# Patient Record
Sex: Female | Born: 1995 | Race: Black or African American | Hispanic: No | Marital: Single | Smoking: Never smoker
Health system: Southern US, Community
[De-identification: ages and names within clinical notes are randomized; demographics above are authoritative.]

## PROBLEM LIST (undated history)

## (undated) DIAGNOSIS — R569 Unspecified convulsions: Secondary | ICD-10-CM

## (undated) DIAGNOSIS — I2699 Other pulmonary embolism without acute cor pulmonale: Secondary | ICD-10-CM

## (undated) DIAGNOSIS — F419 Anxiety disorder, unspecified: Secondary | ICD-10-CM

## (undated) DIAGNOSIS — F329 Major depressive disorder, single episode, unspecified: Secondary | ICD-10-CM

## (undated) DIAGNOSIS — J189 Pneumonia, unspecified organism: Secondary | ICD-10-CM

## (undated) DIAGNOSIS — F32A Depression, unspecified: Secondary | ICD-10-CM

## (undated) HISTORY — DX: Depression, unspecified: F32.A

## (undated) HISTORY — PX: NO PAST SURGERIES: SHX2092

## (undated) HISTORY — DX: Major depressive disorder, single episode, unspecified: F32.9

---
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
## 2014-12-23 ENCOUNTER — Emergency Department (HOSPITAL_COMMUNITY)
Admission: EM | Admit: 2014-12-23 | Discharge: 2014-12-23 | Disposition: A | Discharge status: Home / Self Care | Payer: Medicaid Other | Attending: Emergency Medicine | Admitting: Emergency Medicine

## 2014-12-23 ENCOUNTER — Encounter (HOSPITAL_COMMUNITY): Payer: Self-pay | Admitting: Physical Medicine and Rehabilitation

## 2014-12-23 DIAGNOSIS — I159 Secondary hypertension, unspecified: Secondary | ICD-10-CM | POA: Insufficient documentation

## 2014-12-23 DIAGNOSIS — B085 Enteroviral vesicular pharyngitis: Secondary | ICD-10-CM | POA: Insufficient documentation

## 2014-12-23 DIAGNOSIS — Z880 Allergy status to penicillin: Secondary | ICD-10-CM | POA: Insufficient documentation

## 2014-12-23 MED ORDER — SUCRALFATE 1 GM/10ML PO SUSP: 0.5000 g | Freq: Three times a day (TID) | ORAL | Status: DC

## 2014-12-23 MED ORDER — SUCRALFATE 1 GM/10ML PO SUSP
0.5000 g | Freq: Once | ORAL | Status: AC
  Administered 2014-12-23: 0.5 g via ORAL
  Filled 2014-12-23 (×2): qty 10

## 2014-12-23 NOTE — ED Provider Notes (Signed)
CSN: [id number]     Arrival date & time 12/23/14  1009 History  This chart was scribed for non-physician practitioner Francee PiccoloJennifer Apple Dearmas, PA-C working with Tilden FossaElizabeth Rees, MD by Lyndel SafeKaitlyn Shelton, ED Scribe. This patient was seen in room TR06C/TR06C and the patient's care was started at 10:27 AM.   Chief Complaint  Patient presents with  . Mouth Lesions   Patient is a 19 y.o. female presenting with mouth sores. The history is provided by the patient. No language interpreter was used.  Mouth Lesions Onset quality:  Sudden Severity:  Moderate Duration:  1 day Progression:  Unchanged Chronicity:  New Relieved by:  None tried Worsened by:  Nothing tried Ineffective treatments:  None tried Associated symptoms: no congestion, no fever and no rhinorrhea    HPI Comments: Makayla Livingston is a 19 y.o. female who presents to the Emergency Department complaining of constant, moderate mouth pain with associated lesions onset 1 day ago. She has not taken an alleviating factors pta. She denies experiencing similar symptoms in the past. Pt also denies spots on hands or feet, contacts with children, fevers, rhinorrhea, cough, or congestion.   History reviewed. No pertinent past medical history. History reviewed. No pertinent past surgical history. No family history on file. History  Substance Use Topics  . Smoking status: Never Smoker   . Smokeless tobacco: Not on file  . Alcohol Use: No   OB History    No data available     Review of Systems  Constitutional: Negative for fever.  HENT: Positive for mouth sores. Negative for congestion and rhinorrhea.   Respiratory: Negative for cough.   All other systems reviewed and are negative.   Allergies  Penicillins  Home Medications   Prior to Admission medications   Medication Sig Start Date End Date Taking? Authorizing Provider  Chlorpheniramine Maleate (ALLERGY PO) Take 1 tablet by mouth daily as needed (seasonal allergies).    Historical  Provider, MD  ibuprofen (ADVIL,MOTRIN) 600 MG tablet Take 1 tablet (600 mg total) by mouth every 6 (six) hours as needed for pain. 01/12/13   Marcellina Millinimothy Galey, MD  sucralfate (CARAFATE) 1 GM/10ML suspension Take 5 mLs (0.5 g total) by mouth 4 (four) times daily -  with meals and at bedtime. 12/23/14   Tarissa Kerin, PA-C   BP 143/101 mmHg  Pulse 110  Temp(Src) 98.3 F (36.8 C) (Oral)  Resp 18  SpO2 99% Physical Exam  Constitutional: She is oriented to person, place, and time. She appears well-developed and well-nourished. No distress.  HENT:  Head: Normocephalic and atraumatic.  Right Ear: External ear normal.  Left Ear: External ear normal.  Nose: Nose normal.  Mouth/Throat: Uvula is midline and oropharynx is clear and moist. Oral lesions (buccal ulcerations, tongue ulcerations) present.  Posterior ulcerations. No trismus. No tonsillar swelling or abscess  Eyes: Conjunctivae are normal.  Neck: Normal range of motion. Neck supple.  No nuchal rigidity.   Cardiovascular: Normal rate, regular rhythm and normal heart sounds.   Pulmonary/Chest: Effort normal and breath sounds normal. No respiratory distress.  Abdominal: Soft. There is no tenderness.  Musculoskeletal: Normal range of motion.  Neurological: She is alert and oriented to person, place, and time.  Skin: Skin is warm and dry. She is not diaphoretic.  Psychiatric: She has a normal mood and affect.  Nursing note and vitals reviewed.   ED Course  Procedures  Medications  sucralfate (CARAFATE) 1 GM/10ML suspension 0.5 g (0.5 g Oral Given 12/23/14 1212)  DIAGNOSTIC STUDIES: Oxygen Saturation is 99% on RA, normal by my interpretation.    COORDINATION OF CARE: 10:30 AM Discussed treatment plan which includes to order and prescribe Carafate with pt. Avised ibuprofen, tylenol, and soft diet. Pt acknowledges and agrees to plan.   Labs Review Labs Reviewed - No data to display  Imaging Review No results found.   EKG  Interpretation None      MDM   Final diagnoses:  Herpangina  Secondary hypertension, unspecified    Filed Vitals:   12/23/14 1216  BP: 131/87  Pulse: 84  Temp: 98.3 F (36.8 C)  Resp: 18   Afebrile, NAD, non-toxic appearing, AAOx4. Patient with oral lesions consistent with herpangina. No clinical evidence of dehydration. Will prescribe Carafate for pain.  Patient noted to be hypertensive in the emergency department.  No signs of hypertensive urgency.  Discussed with patient the need for close follow-up and management by their primary care physician.  Return precautions discussed. Patient is agreeable to plan. Patient is stable at time of discharge     I personally performed the services described in this documentation, which was scribed in my presence. The recorded information has been reviewed and is accurate.     Francee Piccolo, PA-C 12/23/14 1351  Tilden Fossa, MD 12/23/14 1409

## 2014-12-23 NOTE — Discharge Instructions (Signed)
Please follow up with your primary care physician in 1-2 days. If you do not have one please call the [facility] number listed above. Please read all discharge instructions and return precautions.    Herpangina  Herpangina is a viral illness that causes sores inside the mouth and throat. It can be passed from person to person (contagious). Most cases of herpangina occur in the summer. CAUSES  Herpangina is caused by a virus. This virus can be spread by saliva and mouth-to-mouth contact. It can also be spread through contact with an infected person's stools. It usually takes 3 to 6 days after exposure to show signs of infection. SYMPTOMS   Fever.  Very sore, red throat.  Small blisters in the back of the throat.  Sores inside the mouth, lips, cheeks, and in the throat.  Blisters around the outside of the mouth.  Painful blisters on the palms of the hands and soles of the feet.  Irritability.  Poor appetite.  Dehydration. DIAGNOSIS  This diagnosis is made by a physical exam. Lab tests are usually not required. TREATMENT  This illness normally goes away on its own within 1 week. Medicines may be given to ease your symptoms. HOME CARE INSTRUCTIONS   Avoid salty, spicy, or acidic food and drinks. These foods may make your sores more painful.  If the patient is a baby or young child, weigh your child daily to check for dehydration. Rapid weight loss indicates there is not enough fluid intake. Consult your caregiver immediately.  Ask your caregiver for specific rehydration instructions.  Only take over-the-counter or prescription medicines for pain, discomfort, or fever as directed by your caregiver. SEEK IMMEDIATE MEDICAL CARE IF:   Your pain is not relieved with medicine.  You have signs of dehydration, such as dry lips and mouth, dizziness, dark urine, confusion, or a rapid pulse. MAKE SURE YOU:  Understand these instructions.  Will watch your  condition.  Will get help right away if you are not doing well or get worse. Document Released: 04/02/2003 Document Revised: 09/26/2011 Document Reviewed: 01/24/2011 [facility] Patient Information 2015 [place]. This information is not intended to replace advice given to you by your health care provider. Make sure you discuss any questions you have with your health care provider.  Hypertension Hypertension, commonly called high blood pressure, is when the force of blood pumping through your arteries is too strong. Your arteries are the blood vessels that carry blood from your heart throughout your body. A blood pressure reading consists of a higher number over a lower number, such as 110/72. The higher number (systolic) is the pressure inside your arteries when your heart pumps. The lower number (diastolic) is the pressure inside your arteries when your heart relaxes. Ideally you want your blood pressure below 120/80. Hypertension forces your heart to work harder to pump blood. Your arteries may become narrow or stiff. Having hypertension puts you at risk for heart disease, stroke, and other problems.  RISK FACTORS Some risk factors for high blood pressure are controllable. Others are not.  Risk factors you cannot control include:   Race. You may be at higher risk if you are African American.  Age. Risk increases with age.  Gender. Men are at higher risk than women before age 20 years. After age 20, women are at higher risk than men. Risk factors you can control include:  Not getting enough exercise or physical activity.  Being overweight.  Getting too much fat, sugar,  calories, or salt in your diet.  Drinking too much alcohol. SIGNS AND SYMPTOMS Hypertension does not usually cause signs or symptoms. Extremely high blood pressure (hypertensive crisis) may cause headache, anxiety, shortness of breath, and nosebleed. DIAGNOSIS  To check if you have hypertension, your health care  provider will measure your blood pressure while you are seated, with your arm held at the level of your heart. It should be measured at least twice using the same arm. Certain conditions can cause a difference in blood pressure between your right and left arms. A blood pressure reading that is higher than normal on one occasion does not mean that you need treatment. If one blood pressure reading is high, ask your health care provider about having it checked again. TREATMENT  Treating high blood pressure includes making lifestyle changes and possibly taking medicine. Living a healthy lifestyle can help lower high blood pressure. You may need to change some of your habits. Lifestyle changes may include:  Following the DASH diet. This diet is high in fruits, vegetables, and whole grains. It is low in salt, red meat, and added sugars.  Getting at least 2 hours of brisk physical activity every week.  Losing weight if necessary.  Not smoking.  Limiting alcoholic beverages.  Learning ways to reduce stress. If lifestyle changes are not enough to get your blood pressure under control, your health care provider may prescribe medicine. You may need to take more than one. Work closely with your health care provider to understand the risks and benefits. HOME CARE INSTRUCTIONS  Have your blood pressure rechecked as directed by your health care provider.   Take medicines only as directed by your health care provider. Follow the directions carefully. Blood pressure medicines must be taken as prescribed. The medicine does not work as well when you skip doses. Skipping doses also puts you at risk for problems.   Do not smoke.   Monitor your blood pressure at home as directed by your health care provider. SEEK MEDICAL CARE IF:   You think you are having a reaction to medicines taken.  You have recurrent headaches or feel dizzy.  You have swelling in your ankles.  You have trouble with your  vision. SEEK IMMEDIATE MEDICAL CARE IF:  You develop a severe headache or confusion.  You have unusual weakness, numbness, or feel faint.  You have severe chest or abdominal pain.  You vomit repeatedly.  You have trouble breathing. MAKE SURE YOU:   Understand these instructions.  Will watch your condition.  Will get help right away if you are not doing well or get worse. Document Released: 07/04/2005 Document Revised: 11/18/2013 Document Reviewed: 04/26/2013 [facility] Patient Information 2015 [place], [place]. This information is not intended to replace advice given to you by your health care provider. Make sure you discuss any questions you have with your health care provider.

## 2014-12-23 NOTE — ED Notes (Signed)
Pt reports sores on tongue. Noticed yesterday. No signs of distress noted.

## 2014-12-24 ENCOUNTER — Inpatient Hospital Stay (HOSPITAL_COMMUNITY): Payer: Medicaid Other

## 2014-12-24 ENCOUNTER — Inpatient Hospital Stay (HOSPITAL_COMMUNITY)
Admission: EM | Admit: 2014-12-24 | Discharge: 2014-12-30 | DRG: 101 | Disposition: A | Discharge status: Home Health | Payer: Medicaid Other | Attending: Internal Medicine | Admitting: Internal Medicine

## 2014-12-24 ENCOUNTER — Encounter (HOSPITAL_COMMUNITY): Payer: Self-pay | Admitting: Emergency Medicine

## 2014-12-24 ENCOUNTER — Emergency Department (HOSPITAL_COMMUNITY): Payer: Medicaid Other

## 2014-12-24 DIAGNOSIS — B958 Unspecified staphylococcus as the cause of diseases classified elsewhere: Secondary | ICD-10-CM | POA: Diagnosis present

## 2014-12-24 DIAGNOSIS — G4700 Insomnia, unspecified: Secondary | ICD-10-CM | POA: Diagnosis present

## 2014-12-24 DIAGNOSIS — I959 Hypotension, unspecified: Secondary | ICD-10-CM | POA: Diagnosis not present

## 2014-12-24 DIAGNOSIS — J9690 Respiratory failure, unspecified, unspecified whether with hypoxia or hypercapnia: Secondary | ICD-10-CM

## 2014-12-24 DIAGNOSIS — R739 Hyperglycemia, unspecified: Secondary | ICD-10-CM | POA: Diagnosis present

## 2014-12-24 DIAGNOSIS — G40301 Generalized idiopathic epilepsy and epileptic syndromes, not intractable, with status epilepticus: Secondary | ICD-10-CM

## 2014-12-24 DIAGNOSIS — N179 Acute kidney failure, unspecified: Secondary | ICD-10-CM | POA: Diagnosis present

## 2014-12-24 DIAGNOSIS — E162 Hypoglycemia, unspecified: Secondary | ICD-10-CM | POA: Diagnosis present

## 2014-12-24 DIAGNOSIS — E876 Hypokalemia: Secondary | ICD-10-CM | POA: Diagnosis present

## 2014-12-24 DIAGNOSIS — R569 Unspecified convulsions: Secondary | ICD-10-CM | POA: Diagnosis present

## 2014-12-24 DIAGNOSIS — B085 Enteroviral vesicular pharyngitis: Secondary | ICD-10-CM | POA: Diagnosis present

## 2014-12-24 DIAGNOSIS — Z68.41 Body mass index (BMI) pediatric, greater than or equal to 95th percentile for age: Secondary | ICD-10-CM | POA: Diagnosis not present

## 2014-12-24 DIAGNOSIS — R7881 Bacteremia: Secondary | ICD-10-CM | POA: Diagnosis present

## 2014-12-24 DIAGNOSIS — G9340 Encephalopathy, unspecified: Secondary | ICD-10-CM | POA: Diagnosis present

## 2014-12-24 DIAGNOSIS — Z452 Encounter for adjustment and management of vascular access device: Secondary | ICD-10-CM

## 2014-12-24 DIAGNOSIS — R0689 Other abnormalities of breathing: Secondary | ICD-10-CM

## 2014-12-24 DIAGNOSIS — D649 Anemia, unspecified: Secondary | ICD-10-CM | POA: Diagnosis present

## 2014-12-24 DIAGNOSIS — J9600 Acute respiratory failure, unspecified whether with hypoxia or hypercapnia: Secondary | ICD-10-CM

## 2014-12-24 DIAGNOSIS — E872 Acidosis: Secondary | ICD-10-CM | POA: Diagnosis present

## 2014-12-24 DIAGNOSIS — Z781 Physical restraint status: Secondary | ICD-10-CM | POA: Diagnosis not present

## 2014-12-24 DIAGNOSIS — R000 Tachycardia, unspecified: Secondary | ICD-10-CM | POA: Diagnosis present

## 2014-12-24 DIAGNOSIS — F329 Major depressive disorder, single episode, unspecified: Secondary | ICD-10-CM | POA: Diagnosis present

## 2014-12-24 DIAGNOSIS — Z9911 Dependence on respirator [ventilator] status: Secondary | ICD-10-CM

## 2014-12-24 LAB — GLUCOSE, CAPILLARY
Glucose-Capillary: 71 mg/dL (ref 65–99)
Glucose-Capillary: 55 mg/dL — ABNORMAL LOW (ref 65–99)
Glucose-Capillary: 102 mg/dL — ABNORMAL HIGH (ref 65–99)
Glucose-Capillary: 68 mg/dL (ref 65–99)
Glucose-Capillary: 86 mg/dL (ref 65–99)

## 2014-12-24 LAB — I-STAT ARTERIAL BLOOD GAS, ED
Acid-base deficit: 6 mmol/L — ABNORMAL HIGH (ref 0.0–2.0)
Bicarbonate: 20.7 mEq/L (ref 20.0–24.0)
O2 Saturation: 100 %
Patient temperature: 98.6
TCO2: 22 mmol/L (ref 0–100)
pCO2 arterial: 42.9 mmHg (ref 35.0–45.0)
pH, Arterial: 7.291 — ABNORMAL LOW (ref 7.350–7.450)
pO2, Arterial: 393 mmHg — ABNORMAL HIGH (ref 80.0–100.0)

## 2014-12-24 LAB — COMPREHENSIVE METABOLIC PANEL
ALK PHOS: 91 U/L (ref 38–126)
ALT: 28 U/L (ref 14–54)
AST: 38 U/L (ref 15–41)
Albumin: 3.5 g/dL (ref 3.5–5.0)
Anion gap: 18 — ABNORMAL HIGH (ref 5–15)
BUN: 13 mg/dL (ref 6–20)
CALCIUM: 9.1 mg/dL (ref 8.9–10.3)
CHLORIDE: 102 mmol/L (ref 101–111)
CO2: 19 mmol/L — ABNORMAL LOW (ref 22–32)
Creatinine, Ser: 1.14 mg/dL — ABNORMAL HIGH (ref 0.44–1.00)
GLUCOSE: 116 mg/dL — AB (ref 65–99)
Potassium: 3.5 mmol/L (ref 3.5–5.1)
SODIUM: 139 mmol/L (ref 135–145)
Total Bilirubin: 0.3 mg/dL (ref 0.3–1.2)
Total Protein: 7.9 g/dL (ref 6.5–8.1)

## 2014-12-24 LAB — URINE MICROSCOPIC-ADD ON

## 2014-12-24 LAB — GRAM STAIN

## 2014-12-24 LAB — URINALYSIS, ROUTINE W REFLEX MICROSCOPIC
Bilirubin Urine: NEGATIVE
Glucose, UA: NEGATIVE mg/dL
Hgb urine dipstick: NEGATIVE
Ketones, ur: >80 mg/dL — AB
Nitrite: NEGATIVE
Protein, ur: NEGATIVE mg/dL
SPECIFIC GRAVITY, URINE: 1.016 (ref 1.005–1.030)
UROBILINOGEN UA: 0.2 mg/dL (ref 0.0–1.0)
pH: 6 (ref 5.0–8.0)

## 2014-12-24 LAB — CSF CELL COUNT WITH DIFFERENTIAL
EOS CSF: 0 % (ref 0–1)
MONOCYTE-MACROPHAGE-SPINAL FLUID: 0 % — AB (ref 15–45)
RBC Count, CSF: 3 /mm3 — ABNORMAL HIGH
Segmented Neutrophils-CSF: 0 % (ref 0–6)
Tube #: 3
WBC, CSF: 2 /mm3 (ref 0–5)

## 2014-12-24 LAB — GLUCOSE, CSF: Glucose, CSF: 64 mg/dL (ref 40–70)

## 2014-12-24 LAB — CBC WITH DIFFERENTIAL/PLATELET
Basophils Absolute: 0 10*3/uL (ref 0.0–0.1)
Basophils Relative: 0 % (ref 0–1)
EOS ABS: 0 10*3/uL (ref 0.0–0.7)
EOS PCT: 0 % (ref 0–5)
HCT: 38.1 % (ref 36.0–46.0)
Hemoglobin: 11.9 g/dL — ABNORMAL LOW (ref 12.0–15.0)
LYMPHS ABS: 2.8 10*3/uL (ref 0.7–4.0)
LYMPHS PCT: 23 % (ref 12–46)
MCH: 23.5 pg — AB (ref 26.0–34.0)
MCHC: 31.2 g/dL (ref 30.0–36.0)
MCV: 75.3 fL — ABNORMAL LOW (ref 78.0–100.0)
MONO ABS: 0.9 10*3/uL (ref 0.1–1.0)
MONOS PCT: 7 % (ref 3–12)
NEUTROS ABS: 8.7 10*3/uL — AB (ref 1.7–7.7)
Neutrophils Relative %: 70 % (ref 43–77)
Platelets: 452 10*3/uL — ABNORMAL HIGH (ref 150–400)
RBC: 5.06 MIL/uL (ref 3.87–5.11)
RDW: 14.8 % (ref 11.5–15.5)
WBC: 12.5 10*3/uL — ABNORMAL HIGH (ref 4.0–10.5)

## 2014-12-24 LAB — RAPID URINE DRUG SCREEN, HOSP PERFORMED
AMPHETAMINES: NOT DETECTED
BENZODIAZEPINES: POSITIVE — AB
Barbiturates: NOT DETECTED
Cocaine: NOT DETECTED
Opiates: NOT DETECTED
Tetrahydrocannabinol: NOT DETECTED

## 2014-12-24 LAB — PROTEIN, CSF: TOTAL PROTEIN, CSF: 13 mg/dL — AB (ref 15–45)

## 2014-12-24 LAB — SALICYLATE LEVEL: Salicylate Lvl: <4 mg/dL (ref 2.8–30.0)

## 2014-12-24 LAB — MRSA PCR SCREENING: MRSA by PCR: NEGATIVE

## 2014-12-24 LAB — ETHANOL: Alcohol, Ethyl (B): <5 mg/dL (ref <5)

## 2014-12-24 LAB — POC URINE PREG, ED: Preg Test, Ur: NEGATIVE

## 2014-12-24 LAB — CBG MONITORING, ED: GLUCOSE-CAPILLARY: 104 mg/dL — AB (ref 65–99)

## 2014-12-24 LAB — ACETAMINOPHEN LEVEL

## 2014-12-24 MED ORDER — SUCCINYLCHOLINE CHLORIDE 20 MG/ML IJ SOLN
INTRAMUSCULAR | Status: AC
  Filled 2014-12-24: qty 1

## 2014-12-24 MED ORDER — FENTANYL CITRATE (PF) 100 MCG/2ML IJ SOLN
100.0000 ug | INTRAMUSCULAR | Status: DC | PRN
  Administered 2014-12-24 – 2014-12-26 (×7): 100 ug via INTRAVENOUS
  Filled 2014-12-24 (×4): qty 2

## 2014-12-24 MED ORDER — SODIUM CHLORIDE 0.9 % IV BOLUS (SEPSIS)
1000.0000 mL | Freq: Once | INTRAVENOUS | Status: AC
  Administered 2014-12-24: 1000 mL via INTRAVENOUS

## 2014-12-24 MED ORDER — LORAZEPAM 2 MG/ML IJ SOLN: 2.0000 mg | Freq: Once | INTRAMUSCULAR | Status: DC

## 2014-12-24 MED ORDER — DEXTROSE 50 % IV SOLN
INTRAVENOUS | Status: AC
  Administered 2014-12-24: 25 mL via INTRAVENOUS
  Filled 2014-12-24: qty 50

## 2014-12-24 MED ORDER — HALOPERIDOL LACTATE 5 MG/ML IJ SOLN
5.0000 mg | Freq: Once | INTRAMUSCULAR | Status: AC
  Administered 2014-12-24: 5 mg via INTRAVENOUS
  Filled 2014-12-24: qty 1

## 2014-12-24 MED ORDER — HEPARIN SODIUM (PORCINE) 5000 UNIT/ML IJ SOLN: 5000.0000 [IU] | Freq: Three times a day (TID) | INTRAMUSCULAR | Status: DC

## 2014-12-24 MED ORDER — LIDOCAINE HCL (CARDIAC) 20 MG/ML IV SOLN
INTRAVENOUS | Status: AC
  Filled 2014-12-24: qty 5

## 2014-12-24 MED ORDER — SODIUM CHLORIDE 0.9 % IV SOLN
500.0000 mg | Freq: Two times a day (BID) | INTRAVENOUS | Status: DC
  Administered 2014-12-24 – 2014-12-27 (×6): 500 mg via INTRAVENOUS
  Filled 2014-12-24 (×6): qty 5

## 2014-12-24 MED ORDER — ETOMIDATE 2 MG/ML IV SOLN
INTRAVENOUS | Status: AC
  Filled 2014-12-24: qty 20

## 2014-12-24 MED ORDER — CHLORHEXIDINE GLUCONATE 0.12 % MT SOLN
15.0000 mL | Freq: Two times a day (BID) | OROMUCOSAL | Status: DC
  Administered 2014-12-24 – 2014-12-26 (×4): 15 mL via OROMUCOSAL
  Filled 2014-12-24 (×4): qty 15

## 2014-12-24 MED ORDER — ROCURONIUM BROMIDE 50 MG/5ML IV SOLN
INTRAVENOUS | Status: AC
  Filled 2014-12-24: qty 2

## 2014-12-24 MED ORDER — DEXTROSE 50 % IV SOLN
INTRAVENOUS | Status: AC
  Administered 2014-12-24: 50 mL
  Filled 2014-12-24: qty 50

## 2014-12-24 MED ORDER — PROPOFOL 1000 MG/100ML IV EMUL
0.0000 ug/kg/min | INTRAVENOUS | Status: DC
  Administered 2014-12-24: 50 ug/kg/min via INTRAVENOUS
  Administered 2014-12-24: 75 ug/kg/min via INTRAVENOUS
  Administered 2014-12-24: 40 ug/kg/min via INTRAVENOUS
  Administered 2014-12-24: 50 ug/kg/min via INTRAVENOUS
  Administered 2014-12-24: 45 ug/kg/min via INTRAVENOUS
  Administered 2014-12-24 – 2014-12-26 (×15): 50 ug/kg/min via INTRAVENOUS
  Administered 2014-12-26: 40 ug/kg/min via INTRAVENOUS
  Administered 2014-12-26: 50 ug/kg/min via INTRAVENOUS
  Filled 2014-12-24 (×22): qty 100

## 2014-12-24 MED ORDER — BISACODYL 10 MG RE SUPP: 10.0000 mg | Freq: Every day | RECTAL | Status: DC | PRN

## 2014-12-24 MED ORDER — CETYLPYRIDINIUM CHLORIDE 0.05 % MT LIQD
7.0000 mL | Freq: Four times a day (QID) | OROMUCOSAL | Status: DC
  Administered 2014-12-25 – 2014-12-26 (×6): 7 mL via OROMUCOSAL

## 2014-12-24 MED ORDER — DOCUSATE SODIUM 50 MG/5ML PO LIQD
100.0000 mg | Freq: Two times a day (BID) | ORAL | Status: DC | PRN
  Filled 2014-12-24: qty 10

## 2014-12-24 MED ORDER — SODIUM CHLORIDE 0.9 % IV SOLN
INTRAVENOUS | Status: DC
  Administered 2014-12-24 – 2014-12-26 (×4): via INTRAVENOUS

## 2014-12-24 MED ORDER — PANTOPRAZOLE SODIUM 40 MG IV SOLR
40.0000 mg | Freq: Every day | INTRAVENOUS | Status: DC
  Administered 2014-12-24 – 2014-12-26 (×3): 40 mg via INTRAVENOUS
  Filled 2014-12-24 (×5): qty 40

## 2014-12-24 MED ORDER — SODIUM CHLORIDE 0.9 % IV SOLN
1000.0000 mg | Freq: Once | INTRAVENOUS | Status: AC
  Administered 2014-12-24: 1000 mg via INTRAVENOUS
  Filled 2014-12-24: qty 10

## 2014-12-24 MED ORDER — SODIUM CHLORIDE 0.9 % IV SOLN: 250.0000 mL | INTRAVENOUS | Status: DC | PRN

## 2014-12-24 MED ORDER — FENTANYL CITRATE (PF) 100 MCG/2ML IJ SOLN
100.0000 ug | INTRAMUSCULAR | Status: DC | PRN
  Administered 2014-12-25: 100 ug via INTRAVENOUS
  Filled 2014-12-24 (×2): qty 2

## 2014-12-24 MED ORDER — PROPOFOL 1000 MG/100ML IV EMUL
INTRAVENOUS | Status: AC
  Administered 2014-12-24: 20 ug/kg/min
  Filled 2014-12-24: qty 100

## 2014-12-24 MED ORDER — DEXTROSE 10 % IV SOLN
INTRAVENOUS | Status: DC
  Administered 2014-12-24 – 2014-12-27 (×3): via INTRAVENOUS

## 2014-12-24 MED ORDER — LORAZEPAM 2 MG/ML IJ SOLN
2.0000 mg | Freq: Once | INTRAMUSCULAR | Status: AC
  Administered 2014-12-24: 2 mg via INTRAVENOUS
  Filled 2014-12-24: qty 1

## 2014-12-24 MED ORDER — PROPOFOL 1000 MG/100ML IV EMUL
INTRAVENOUS | Status: AC
  Administered 2014-12-24: 75 ug/kg/min via INTRAVENOUS
  Filled 2014-12-24: qty 100

## 2014-12-24 MED ORDER — SODIUM CHLORIDE 0.9 % IV SOLN
500.0000 mg | Freq: Two times a day (BID) | INTRAVENOUS | Status: DC
  Filled 2014-12-24 (×2): qty 5

## 2014-12-24 MED ORDER — ETOMIDATE 2 MG/ML IV SOLN
INTRAVENOUS | Status: DC | PRN
  Administered 2014-12-24: 30 mg via INTRAVENOUS

## 2014-12-24 MED ORDER — FENTANYL CITRATE (PF) 100 MCG/2ML IJ SOLN
25.0000 ug | INTRAMUSCULAR | Status: DC | PRN
  Administered 2014-12-24: 100 ug via INTRAVENOUS
  Filled 2014-12-24 (×5): qty 2

## 2014-12-24 MED ORDER — LORAZEPAM 2 MG/ML IJ SOLN
INTRAMUSCULAR | Status: AC
  Filled 2014-12-24: qty 1

## 2014-12-24 MED ORDER — LORAZEPAM 2 MG/ML IJ SOLN
2.0000 mg | Freq: Once | INTRAMUSCULAR | Status: AC
Start: 2014-12-24 — End: 2014-12-24
  Administered 2014-12-24: 2 mg via INTRAMUSCULAR
  Filled 2014-12-24: qty 1

## 2014-12-24 MED ORDER — LORAZEPAM 2 MG/ML IJ SOLN: 2.0000 mg | INTRAMUSCULAR | Status: DC | PRN

## 2014-12-24 MED ORDER — SUCCINYLCHOLINE CHLORIDE 20 MG/ML IJ SOLN
INTRAMUSCULAR | Status: DC | PRN
  Administered 2014-12-24: 100 mg via INTRAVENOUS

## 2014-12-24 MED ORDER — DEXTROSE 50 % IV SOLN
25.0000 mL | Freq: Once | INTRAVENOUS | Status: AC
  Administered 2014-12-24: 25 mL via INTRAVENOUS

## 2014-12-24 NOTE — Progress Notes (Signed)
eLink Physician-Brief Progress Note Patient Name: Makayla Livingston DOB: 1996/02/08 MRN: [id number]   Date of Service  12/24/2014  HPI/Events of Note  Hypoglycemia. Blood glucose = 55.  eICU Interventions  Will start D10W IV infusion at 30 mL/hour.      Intervention Category Intermediate Interventions: Other:  Makayla Livingston,Makayla Livingston 12/24/2014, 6:19 PM

## 2014-12-24 NOTE — Progress Notes (Signed)
LCSW working with [facility] [employer] DutyFelicia Livingston with regards to patient's community support/insurance. Patient's mother has received orange card however patient was in the process of completing application. Mother present in Emergency Room with patient.  Patient lives at home with mother, active with school at [employer].  No formal SW needs at this time. Active with support from SW and [employer]. Called RN CM with patient status and patient moving to [phone].    Please re consult CSW is needs arise.  Makayla EmoryHannah Estuardo Frisbee LCSW, MSW Clinical Social Work: Emergency Room [phone]

## 2014-12-24 NOTE — ED Notes (Signed)
Pt. Radial pulses 2+ and pedal pulses 2+ pt still moving extremities propofol bolus to be given.

## 2014-12-24 NOTE — ED Notes (Signed)
Medically indicated restraints appropriately applied by [facility] & Dahlia Byesraci Bast RN.   Radial and pedal pulses 2+ present. Pt has some ROM with straps appropriately to stretcher.

## 2014-12-24 NOTE — ED Provider Notes (Signed)
CSN: [id number]     Arrival date & time 12/24/14  0532 History   First MD Initiated Contact with Patient 12/24/14 [phone]     Chief Complaint  Patient presents with  . Seizures     (Consider location/radiation/quality/duration/timing/severity/associated sxs/prior Treatment) Patient is a 19 y.o. female presenting with seizures.  Seizures Seizure activity on arrival: no   Seizure type:  Grand mal Initial focality:  None Episode characteristics: abnormal movements, generalized shaking, tongue biting and unresponsiveness   Postictal symptoms: confusion and somnolence   Return to baseline: no   Severity:  Severe Duration:  1 minute Timing:  Clustered Number of seizures this episode:  2 Progression:  Unchanged Context comment:  Unknown PTA treatment:  Midazolam   History reviewed. No pertinent past medical history. History reviewed. No pertinent past surgical history. History reviewed. No pertinent family history. History  Substance Use Topics  . Smoking status: Never Smoker   . Smokeless tobacco: Not on file  . Alcohol Use: No   OB History    No data available     Review of Systems  Unable to perform ROS: Mental status change  Neurological: Positive for seizures.      Allergies  Penicillins and Pollen extract  Home Medications   Prior to Admission medications   Medication Sig Start Date End Date Taking? Authorizing Provider  naproxen (NAPROSYN) 500 MG tablet Take 500 mg by mouth daily.   Yes Historical Provider, MD  sucralfate (CARAFATE) 1 GM/10ML suspension Take 5 mLs (0.5 g total) by mouth 4 (four) times daily -  with meals and at bedtime. 12/23/14  Yes Jennifer Piepenbrink, PA-C  ibuprofen (ADVIL,MOTRIN) 600 MG tablet Take 1 tablet (600 mg total) by mouth every 6 (six) hours as needed for pain. Patient not taking: Reported on 12/24/2014 01/12/13   Marcellina Millin, MD   BP 116/55 mmHg  Pulse 78  Temp(Src) 98.1 F (36.7 C) (Oral)  Resp 16  Ht 5\' 7"  (1.702 m)  Wt 323  lb (146.512 kg)  BMI 50.58 kg/m2  SpO2 100%  LMP 12/22/2014 Physical Exam  Constitutional: She appears well-developed and well-nourished.  HENT:  Head: Normocephalic and atraumatic.  Right Ear: External ear normal.  Left Ear: External ear normal.  Eyes: Conjunctivae and EOM are normal. Pupils are equal, round, and reactive to light.  Neck: Normal range of motion. Neck supple.  Cardiovascular: Regular rhythm, normal heart sounds and intact distal pulses.  Tachycardia present.   Pulmonary/Chest: Effort normal and breath sounds normal.  Abdominal: Soft. Bowel sounds are normal. There is no tenderness.  Musculoskeletal: Normal range of motion.  Neurological: She is alert. GCS eye subscore is 4. GCS verbal subscore is 3. GCS motor subscore is 5.  Pt with intermittent eye opening and response to stimuli, but with repeated nose scratching  Skin: Skin is warm and dry.  Vitals reviewed.   ED Course  INTUBATION Date/Time: 12/24/2014 8:15 AM Performed by: Mirian Mo Authorized by: Mirian Mo Consent: The procedure was performed in an emergent situation. Verbal consent obtained. Indications: airway protection Intubation method: video-assisted Patient status: paralyzed (RSI) Preoxygenation: nonrebreather mask Laryngoscope size: Mac 4 Tube size: 7.5 mm Tube type: cuffed Number of attempts: 1 Cords visualized: yes Post-procedure assessment: chest rise and CO2 detector Breath sounds: reduced on left Cuff inflated: yes ETT to lip: 25 cm Tube secured with: ETT holder Chest x-ray interpreted by me, other physician and radiologist. Chest x-ray findings: endotracheal tube too low Tube repositioned: tube repositioned successfully Patient  tolerance: Patient tolerated the procedure well with no immediate complications   (including critical care time) Labs Review Labs Reviewed  CBC WITH DIFFERENTIAL/PLATELET - Abnormal; Notable for the following:    WBC 12.5 (*)    Hemoglobin 11.9  (*)    MCV 75.3 (*)    MCH 23.5 (*)    Platelets 452 (*)    Neutro Abs 8.7 (*)    All other components within normal limits  COMPREHENSIVE METABOLIC PANEL - Abnormal; Notable for the following:    CO2 19 (*)    Glucose, Bld 116 (*)    Creatinine, Ser 1.14 (*)    Anion gap 18 (*)    All other components within normal limits  URINE RAPID DRUG SCREEN (HOSP PERFORMED) NOT AT [facility] - Abnormal; Notable for the following:    Benzodiazepines POSITIVE (*)    All other components within normal limits  ACETAMINOPHEN LEVEL - Abnormal; Notable for the following:    Acetaminophen (Tylenol), Serum <10 (*)    All other components within normal limits  URINALYSIS, ROUTINE W REFLEX MICROSCOPIC (NOT AT [facility]) - Abnormal; Notable for the following:    APPearance TURBID (*)    Ketones, ur >80 (*)    Leukocytes, UA SMALL (*)    All other components within normal limits  URINE MICROSCOPIC-ADD ON - Abnormal; Notable for the following:    Crystals URIC ACID CRYSTALS (*)    All other components within normal limits  PROTEIN, CSF - Abnormal; Notable for the following:    Total  Protein, CSF 13 (*)    All other components within normal limits  CSF CELL COUNT WITH DIFFERENTIAL - Abnormal; Notable for the following:    RBC Count, CSF 3 (*)    Monocyte-Macrophage-Spinal Fluid 0 (*)    All other components within normal limits  GLUCOSE, CAPILLARY - Abnormal; Notable for the following:    Glucose-Capillary 55 (*)    All other components within normal limits  GLUCOSE, CAPILLARY - Abnormal; Notable for the following:    Glucose-Capillary 102 (*)    All other components within normal limits  CBG MONITORING, ED - Abnormal; Notable for the following:    Glucose-Capillary 104 (*)    All other components within normal limits  I-STAT ARTERIAL BLOOD GAS, ED - Abnormal; Notable for the following:    pH, Arterial 7.291 (*)    pO2, Arterial 393.0 (*)    Acid-base deficit 6.0 (*)    All other components within normal  limits  MRSA PCR SCREENING  GRAM STAIN  CSF CULTURE  CULTURE, BLOOD (ROUTINE X 2)  CULTURE, BLOOD (ROUTINE X 2)  URINE CULTURE  CULTURE, RESPIRATORY (NON-EXPECTORATED)  SALICYLATE LEVEL  ETHANOL  GLUCOSE, CAPILLARY  GLUCOSE, CSF  GLUCOSE, CAPILLARY  GLUCOSE, CAPILLARY  HERPES SIMPLEX VIRUS(HSV) DNA BY PCR  TRIGLYCERIDES  HERPES SIMPLEX VIRUS(HSV) DNA BY PCR  COMPREHENSIVE METABOLIC PANEL  CBC  MAGNESIUM  PHOSPHORUS  BLOOD GAS, ARTERIAL  POC URINE PREG, ED    Imaging Review Ct Head Wo Contrast  12/24/2014   CLINICAL DATA:  Seizure. Initial encounter. Patient intubated following seizure.  EXAM: CT HEAD WITHOUT CONTRAST  TECHNIQUE: Contiguous axial images were obtained from the base of the skull through the vertex without intravenous contrast.  COMPARISON:  None.  FINDINGS: Nonstandard scan plane was used because of obese body habitus and intubation. Endotracheal tube and probable orogastric tube present in the oropharynx. Debris is present in the posterior pharynx. Piercing is present over the RIGHT  mandible.  No mass lesion, mass effect, midline shift, hydrocephalus, hemorrhage. No territorial ischemia or acute infarction. The calvarium is intact. Frontal sinuses and ethmoid air cells appear within normal limits. Over the anterior frontal lobes, there is slightly higher attenuation is most compatible with beam hardening artifact. This artifact is accentuated by the imaging plane.  IMPRESSION: 1. No acute intracranial abnormality. 2. Nonstandard imaging plane secondary to body habitus.   Electronically Signed   By: Andreas Newport M.D.   On: 12/24/2014 09:10   Dg Chest Port 1 View  12/24/2014   CLINICAL DATA:  Acute respiratory failure. Seizures. Altered mental status.  EXAM: PORTABLE CHEST - 1 VIEW  COMPARISON:  12/24/2014 at 0755 hours.  FINDINGS: Support apparatus: Endotracheal tube tip 3.7 cm from the carina. The tip is at the level of the clavicles. Enteric tube is present with the tip  near the cardia of the stomach.  Cardiomediastinal Silhouette:  Unchanged allowing for projection.  Lungs: Increasing opacification in the RIGHT upper lobe. No pneumothorax.  Effusions:  None.  Other:  None.  IMPRESSION: 1. Retraction of endotracheal tube, now 3.7 cm from the carina. 2. Lower lung volumes with opacity in the RIGHT upper lobe, probably representing atelectasis although airspace disease could also have this appearance.   Electronically Signed   By: Andreas Newport M.D.   On: 12/24/2014 15:51   Dg Chest Portable 1 View  12/24/2014   CLINICAL DATA:  19 year old female status post seizure, intubated. Initial encounter.  EXAM: PORTABLE CHEST - 1 VIEW  COMPARISON:  11/11/2007.  FINDINGS: Portable AP semi upright view at 0755 hours. Large body habitus. The tip of the endotracheal tube is just inside the right mainstem bronchus. Enteric tube also in place, loops in the left upper quadrant below the diaphragm.  Lower lung volumes with left greater than right perihilar opacity, air bronchograms on the left. No pneumothorax. Scoliosis. No acute osseous abnormality identified.  IMPRESSION: 1. Right mainstem bronchus intubation. Retract the ET tube 2 cm for more optimal placement. 2. Enteric tube loops at the level of the gastric fundus. 3. Left greater than right pulmonary opacity might reflect a combination of atelectasis and aspiration in this setting. Study discussed by telephone with Dr. Mirian Mo on 12/24/2014 at 08:06 .   Electronically Signed   By: Odessa Fleming M.D.   On: 12/24/2014 08:06   Dg Fluoro Guide Lumbar Puncture  12/24/2014   CLINICAL DATA:  19 year old female with seizure, recent diagnosis of Herpangina. Unsuccessful bedside lumbar puncture. Initial encounter.  EXAM: DIAGNOSTIC LUMBAR PUNCTURE UNDER FLUOROSCOPIC GUIDANCE  FLUOROSCOPY TIME:  Radiation Exposure Index (as provided by the fluoroscopic device):  If the device does not provide the exposure index:  Fluoroscopy Time (in minutes and  seconds):  0 minutes 30 seconds  Number of Acquired Images:  2  PROCEDURE: Informed consent was obtained via the telephone from the patient's mother. A "time-out" was performed.  The patient was already intubated. She was placed prone, the lower back was prepped with Betadine.  1% Lidocaine was used for local anesthesia. Lumbar puncture was performed at the L2-L3 level using a 5 inch 20 knee gauge needle with return of clear, colorless CSF with an opening pressure of 15 cm water. 14 mL of CSF were obtained for laboratory studies.  The patient tolerated the procedure well and there were no apparent complications.  IMPRESSION: Fluoroscopic guided lumbar puncture. Clear CSF and normal opening pressure. 14 mL of CSF collected for lab analysis.  Electronically Signed   By: Odessa FlemingH  Hall M.D.   On: 12/24/2014 15:52     EKG Interpretation   Date/Time:  Wednesday December 24 2014 06:08:12 EDT Ventricular Rate:  103 PR Interval:  204 QRS Duration: 76 QT Interval:  371 QTC Calculation: 486 R Axis:   60 Text Interpretation:  Sinus tachycardia Borderline prolonged QT interval  No old tracing to compare Confirmed by Mirian MoGentry, Matthew [phone] on 12/24/2014  6:14:27 AM       CRITICAL CARE Performed by: Mirian MoMatthew Gentry   Total critical care time: 45 min  Critical care time was exclusive of separately billable procedures and treating other patients.  Critical care was necessary to treat or prevent imminent or life-threatening deterioration.  Critical care was time spent personally by me on the following activities: development of treatment plan with patient and/or surrogate as well as nursing, discussions with consultants, evaluation of patient's response to treatment, examination of patient, obtaining history from patient or surrogate, ordering and performing treatments and interventions, ordering and review of laboratory studies, ordering and review of radiographic studies, pulse oximetry and re-evaluation of  patient's condition.   MDM   Final diagnoses:  New onset seizure  Respiratory failure  Acute respiratory failure  Encounter for central line placement    19 y.o. female with pertinent PMH of recent visit for oral lesions presents with new onset seizure.  Witnessed by EMS, 1 min x 2, no response to versed.  Unknown history from family.  On arrival pt with frequent nose scratching, not answering questions, but will track people around room.  HR with tachycardia, sinus.  Pt received 10 mg haldol, 4mg  ativan, keppra and still combative intermittently with recurrent seizures.  Seizures full body, tonic/clonic, with L sided gaze preference.  Intubated for airway protection and stronger seizure control.  Consulted critical care, neurology.    I have reviewed all laboratory and imaging studies if ordered as above  1. New onset seizure   2. Respiratory failure   3. Acute respiratory failure   4. Encounter for central line placement         Mirian MoMatthew Gentry, MD 12/25/14 0120

## 2014-12-24 NOTE — Sedation Documentation (Signed)
Pt moved to Trauma B for intubation, Respiratory, Hayley RN, Traci RN, Corliss Blackerobert EMT, Sabino DickNatasha NT, Littie DeedsGentry MD & Criss AlvineGoldston MD at bedside.

## 2014-12-24 NOTE — Consult Note (Addendum)
Reason for Consult:New onset seizures  Referring Physician: Craige Cotta   CC: Seizures  HPI: Makayla Livingston is an 19 y.o. female who was seen in the ED on yesterday for oral lesions and diagnosed with herpangina.  Patient was sent home with Carafate.  This morning mother heard something as if patient had fallen.  Patient was then noted to have a seizure at home.  EMS was called and patient was brought to the ED where she was noted to have another generalized seizure.  Patient required intubation and sedation.  Patient has had no fevers.    History reviewed. No pertinent past medical history.  History reviewed. No pertinent past surgical history.  Family history: Mother alive with hypertension.  Father and brother alive and well with no medical problems.  No family history of seizures.    Social History:  reports that she has never smoked. She does not have any smokeless tobacco history on file. She reports that she does not drink alcohol or use illicit drugs. A student at [employer]  Allergies  Allergen Reactions  . Penicillins Hives, Nausea And Vomiting and Rash    Medications:  I have reviewed the patient's current medications. Prior to Admission:  Prescriptions prior to admission  Medication Sig Dispense Refill Last Dose  . Chlorpheniramine Maleate (ALLERGY PO) Take 1 tablet by mouth daily as needed (seasonal allergies).   Past Month at Unknown  . ibuprofen (ADVIL,MOTRIN) 600 MG tablet Take 1 tablet (600 mg total) by mouth every 6 (six) hours as needed for pain. 30 tablet 0   . sucralfate (CARAFATE) 1 GM/10ML suspension Take 5 mLs (0.5 g total) by mouth 4 (four) times daily -  with meals and at bedtime. 420 mL 0     ROS: History obtained from mother  General ROS: negative for - chills, fatigue, fever, night sweats, weight gain or weight loss Psychological ROS: increased stressors Ophthalmic ROS: negative for - blurry vision, double vision, eye pain or loss of vision ENT ROS: tongue  swollen with lesions Allergy and Immunology ROS: negative for - hives or itchy/watery eyes Hematological and Lymphatic ROS: negative for - bleeding problems, bruising or swollen lymph nodes Endocrine ROS: negative for - galactorrhea, hair pattern changes, polydipsia/polyuria or temperature intolerance Respiratory ROS: negative for - cough, hemoptysis, shortness of breath or wheezing Cardiovascular ROS: negative for - chest pain, dyspnea on exertion, edema or irregular heartbeat Gastrointestinal ROS: negative for - abdominal pain, diarrhea, hematemesis, nausea/vomiting or stool incontinence Genito-Urinary ROS: negative for - dysuria, hematuria, incontinence or urinary frequency/urgency Musculoskeletal ROS: negative for - joint swelling or muscular weakness Neurological ROS: as noted in HPI Dermatological ROS: negative for rash and skin lesion changes  Physical Examination: Blood pressure 106/57, pulse 83, temperature 96.6 F (35.9 C), temperature source Other (Comment), resp. rate 18, height  (1.702 m), weight 146.512 kg (323 lb), last menstrual period 12/22/2014, SpO2 100 %.  HEENT-  Normocephalic, no lesions, without obvious abnormality.  Normal external eye and conjunctiva.  Normal TM's bilaterally.  Normal auditory canals and external ears. Normal external nose, mucus membranes and septum.  Normal pharynx. Cardiovascular- S1, S2 normal, pulses palpable throughout   Lungs- chest clear, no wheezing, rales, normal symmetric air entry Abdomen- soft, non-tender; bowel sounds normal; no masses,  no organomegaly Extremities- no edema Lymph-no adenopathy palpable Musculoskeletal-no joint tenderness, deformity or swelling Skin-warm and dry, no hyperpigmentation, vitiligo, or suspicious lesions  Neurological Examination Mental Status: Patient does not respond to verbal stimuli.  With  deep sternal rub some movement noted in the upper extremities bilaterally.  Does not follow commands.  No  verbalizations noted.  Cranial Nerves: II: patient does not respond confrontation bilaterally, pupils right 3 mm, left 3 mm,and minimally reactive bilaterally III,IV,VI: doll's response absent bilaterally.  V,VII: corneal reflex reduced bilaterally  VIII: patient does not respond to verbal stimuli IX,X: gag reflex reduced, XI: trapezius strength unable to test bilaterally XII: tongue strength unable to test Motor: Extremities flaccid throughout.  No spontaneous movement noted.  No purposeful movements noted. Sensory: Responds to noxious stimuli in all extremities. Deep Tendon Reflexes:  Absent throughout. Plantars: downgoing bilaterally Cerebellar: Unable to perform    Laboratory Studies:   Basic Metabolic Panel:  Recent Labs Lab 12/24/14 0610  NA 139  K 3.5  CL 102  CO2 19*  GLUCOSE 116*  BUN 13  CREATININE 1.14*  CALCIUM 9.1    Liver Function Tests:  Recent Labs Lab 12/24/14 0610  AST 38  ALT 28  ALKPHOS 91  BILITOT 0.3  PROT 7.9  ALBUMIN 3.5   No results for input(s): LIPASE, AMYLASE in the last 168 hours. No results for input(s): AMMONIA in the last 168 hours.  CBC:  Recent Labs Lab 12/24/14 0610  WBC 12.5*  NEUTROABS 8.7*  HGB 11.9*  HCT 38.1  MCV 75.3*  PLT 452*    Cardiac Enzymes: No results for input(s): CKTOTAL, CKMB, CKMBINDEX, TROPONINI in the last 168 hours.  BNP: Invalid input(s): POCBNP  CBG:  Recent Labs Lab 12/24/14 0627 12/24/14 1237  GLUCAP 104* 71    Microbiology: No results found for this or any previous visit.  Coagulation Studies: No results for input(s): LABPROT, INR in the last 72 hours.  Urinalysis:  Recent Labs Lab 12/24/14 1131  COLORURINE YELLOW  LABSPEC 1.016  PHURINE 6.0  GLUCOSEU NEGATIVE  HGBUR NEGATIVE  BILIRUBINUR NEGATIVE  KETONESUR >80*  PROTEINUR NEGATIVE  UROBILINOGEN 0.2  NITRITE NEGATIVE  LEUKOCYTESUR SMALL*    Lipid Panel:  No results found for: CHOL, TRIG, HDL, CHOLHDL,  VLDL, LDLCALC  HgbA1C: No results found for: HGBA1C  Urine Drug Screen:     Component Value Date/Time   LABOPIA NONE DETECTED 12/24/2014 0812   COCAINSCRNUR NONE DETECTED 12/24/2014 0812   LABBENZ POSITIVE* 12/24/2014 0812   AMPHETMU NONE DETECTED 12/24/2014 0812   THCU NONE DETECTED 12/24/2014 0812   LABBARB NONE DETECTED 12/24/2014 0812    Alcohol Level:  Recent Labs Lab 12/24/14 0610  ETH <5    Other results: EKG: sinus tachycardia at 103 bpm.  Imaging: Ct Head Wo Contrast  12/24/2014   CLINICAL DATA:  Seizure. Initial encounter. Patient intubated following seizure.  EXAM: CT HEAD WITHOUT CONTRAST  TECHNIQUE: Contiguous axial images were obtained from the base of the skull through the vertex without intravenous contrast.  COMPARISON:  None.  FINDINGS: Nonstandard scan plane was used because of obese body habitus and intubation. Endotracheal tube and probable orogastric tube present in the oropharynx. Debris is present in the posterior pharynx. Piercing is present over the RIGHT mandible.  No mass lesion, mass effect, midline shift, hydrocephalus, hemorrhage. No territorial ischemia or acute infarction. The calvarium is intact. Frontal sinuses and ethmoid air cells appear within normal limits. Over the anterior frontal lobes, there is slightly higher attenuation is most compatible with beam hardening artifact. This artifact is accentuated by the imaging plane.  IMPRESSION: 1. No acute intracranial abnormality. 2. Nonstandard imaging plane secondary to body habitus.   Electronically Signed  By: Andreas Newport M.D.   On: 12/24/2014 09:10   Dg Chest Portable 1 View  12/24/2014   CLINICAL DATA:  19 year old female status post seizure, intubated. Initial encounter.  EXAM: PORTABLE CHEST - 1 VIEW  COMPARISON:  11/11/2007.  FINDINGS: Portable AP semi upright view at 0755 hours. Large body habitus. The tip of the endotracheal tube is just inside the right mainstem bronchus. Enteric tube also in  place, loops in the left upper quadrant below the diaphragm.  Lower lung volumes with left greater than right perihilar opacity, air bronchograms on the left. No pneumothorax. Scoliosis. No acute osseous abnormality identified.  IMPRESSION: 1. Right mainstem bronchus intubation. Retract the ET tube 2 cm for more optimal placement. 2. Enteric tube loops at the level of the gastric fundus. 3. Left greater than right pulmonary opacity might reflect a combination of atelectasis and aspiration in this setting. Study discussed by telephone with Dr. Mirian Mo on 12/24/2014 at 08:06 .   Electronically Signed   By: Odessa Fleming M.D.   On: 12/24/2014 08:06     Assessment/Plan: 19 year old female presenting with new onset seizures. No history of seizures.  Patient currently intubated and sedated.  Keppra loaded in the ED.  No further clinical seizure activity noted.  Although no history of fever or fever on presentation, with recent encounter would like to rule out infection.  Head CT personally reviewed and shows no acute changes,    Recommendations: 1.  EEG stat to rule out continued subclinical seizure activity 2.  Continue Keppra at  IV q12hours 3.  Continue seizure precautions' 4.  LP with routine studies to be sent including cell count, diff, protein, glucose, gram stain, CSF culture, HSV.  LP attempted by me in ED and unsuccessful.  LP to be attempted under fluoro before initiation of coverage if possible.   5.  MRI of brain with and without contrast when medically reasonable.      This patient is critically ill and at significant risk of neurological worsening, death and care requires constant monitoring of vital signs, hemodynamics,respiratory and cardiac monitoring, neurological assessment, discussion with family, other specialists and medical decision making of high complexity. I spent 45 minutes of neurocritical care time  in the care of  this patient.  Thana Farr, MD [facility] [phone] 12/24/2014  1:09 PM

## 2014-12-24 NOTE — ED Notes (Signed)
Pt actively having seizure at this time 

## 2014-12-24 NOTE — ED Notes (Signed)
Portable at bedside 

## 2014-12-24 NOTE — ED Notes (Signed)
Dr. Thad Rangereynolds attempted LP, sterile technique was maintained. Unable to perform due to large body habitus.

## 2014-12-24 NOTE — Care Management Note (Signed)
Case Management Note  Patient Details  Name: Lodema HongBriani S Fels MRN: [id number] Date of Birth: 01/01/1996  Subjective/Objective:         Lives at home with mother.  Independent prior.             Action/Plan:   Expected Discharge Date:                  Expected Discharge Plan:     In-House Referral:     Discharge planning Services     Post Acute Care Choice:    Choice offered to:     DME Arranged:    DME Agency:     HH Arranged:    HH Agency:     Status of Service:  In process, will continue to follow  Medicare Important Message Given:    Date Medicare IM Given:    Medicare IM give by:    Date Additional Medicare IM Given:    Additional Medicare Important Message give by:     If discussed at Long Length of Stay Meetings, dates discussed:    Additional Comments:  Vangie BickerBrown, Xzavian Semmel Jane, RN 12/24/2014, 2:44 PM

## 2014-12-24 NOTE — Progress Notes (Signed)
RT pulled ETT back 2cm. Pt tol well

## 2014-12-24 NOTE — Procedures (Signed)
Procedure: LP with Fluoro guidance at L2-3. Specimen: CSF Bleeding: none. Complications: None immediate. Patient   -Condition: Stable.  -Disposition:  To ICU.  Full Radiology Report under IMAGING

## 2014-12-24 NOTE — Progress Notes (Deleted)
CRITICAL VALUE ALERT  Critical value received:  CBG 55  Date of notification:  12/24/14  Time of notification:  1631  Critical value read back:Yes.    Nurse who received alert:  Vashti HeyAmy Else Habermann-RN  MD notified (1st page):  Dr. Vassie LollAlva  Time of first page:  1635  MD notified (2nd page):  Time of second page:  Responding MD:  Dr Vassie LollAlva  Time MD responded:  [phone]

## 2014-12-24 NOTE — ED Notes (Signed)
Portable Xray called.

## 2014-12-24 NOTE — Progress Notes (Signed)
Buddy DutyFelicia Evans [facility] [phone]  Spoke with patients mother via telephone regarding pt being sick and needing primary care earlier this week. Patients mom stopped me in the hallway to inform me that patient is here in the ED. I recently enrolled patients mother for the [facility] so patient and mother are familiar with the process. Orange card application given and explained, pts mother was informed to contact me once discharge to get pt enrolled. No other Community Health & Eligibility Specialist needs identified at this time.

## 2014-12-24 NOTE — Procedures (Signed)
ELECTROENCEPHALOGRAM REPORT   Patient: Makayla Livingston      Room #:  Age: 19 y.o.        Sex: female Referring Physician: Dr Craige CottaSood Report Date:  12/24/2014        Interpreting Physician: Omelia BlackwaterSUMNER, Irja Wheless JUSTIN  History: Makayla Livingston is an 19 y.o. female admitted with recurrent seizures  Medications:  Continuous: . sodium chloride 75 mL/hr at 12/24/14 1035  . propofol (DIPRIVAN) infusion 50 mcg/kg/min (12/24/14 1144)    Conditions of Recording:  This is a 16 channel EEG carried out with the patient in the intubated and sedated state. Currently on propofol for sedation.  Description:  The waking background activity consists of a low voltage, symmetrical, fairly well organized mix of theta and alpha activity, seen from the parieto-occipital and posterior temporal regions.  Low voltage fast activity, poorly organized, is seen anteriorly and is at times superimposed on more posterior regions.  A mixture of theta and alpha rhythms are seen from the central and temporal regions. Intermittent right sided sharp wave activity and focal slowing is noted. It appears to have a frontal predominance with possible phase reversal at F4. No clear evolution to seizure activity noted.   Normal sleep architecture is not observed. Hyperventilation was not performed. Intermittent photic stimulation was not performed.    IMPRESSION: Abnormal EEG due to the presence of intermittent sharp waves and focal slowing in the right frontal region indicating a potential seizure foci. No clear evolution to seizure activity noted.    Elspeth Choeter Posey Petrik, DO [facility] [phone]  If 7pm- 7am, please page neurology on call as listed in AMION. 12/24/2014, 1:12 PM

## 2014-12-24 NOTE — ED Notes (Signed)
Patient had full body seizure, suction utilized, airway cart at bedside, NPA placed. Dr. Littie DeedsGentry at bedside- ativan on hold. Discussing intubation with family.

## 2014-12-24 NOTE — H&P (Signed)
PULMONARY / CRITICAL CARE MEDICINE   Name: Makayla Livingston MRN: [id number] DOB: 1996-07-02    ADMISSION DATE:  12/24/2014 CONSULTATION DATE:  12/24/14  REFERRING MD :  Dr. Littie DeedsGentry   CHIEF COMPLAINT:  AMS/Seizures   INITIAL PRESENTATION: 19 y/o F with no known PMH who [facility].  Developed seizures in ER and subsequently intubated.    STUDIES:  6/08 Head CT >> no acute abnormality   SIGNIFICANT EVENTS: 6/07 Seen in ER for oral lesions, treated with carafate & d/c'd 6/08  [facility], developed seizures in ER and intubated.     HISTORY OF PRESENT ILLNESS:  19 y/o F with no known PMH who on 6/7 presented to ER with complaints of sores on her tongue that began 6/6 with associated mouth pain.  She was treated with carafate and discharged.    At baseline, she lives with her mother and is a Consulting civil engineerstudent at [employer].  She is studying biochemistry and psychology.  Mother denies known drug use and medical hx.  The patient has one brother and he is healthy.  No known bub bites / wounds.  Non-smoker.   Mother reports on 6/6 the patient didn't feel well at school and left early. She slept most of they day.  Then was seen in ER on 6/7.  The patient went home and was in her usual state of health.  The mother reports the am of 6/8 she woke to a loud noise and found her daughter on the floor with a bookshelf on top of her.  She was altered and unable to re-orient her.  EMS was activated and was witnessed to have 2 seizures.  In the ER, she had 4 more generalized full body seizures.  She was treated with IV ativan and haldol for agitation.  She continued to have seizures and decision was made to intubate for airway protection.  She had a total of 7 seizures between EMS and in the ER since presentation.  The patient had significant agitation requiring 4 point restraints.  Placed on propofol and CT head evaluated which was negative.  Mother denies known fevers / chills, sick exposures.   PCCM called for ICU admission.       PAST MEDICAL HISTORY :   has no past medical history on file.  has no past surgical history on file.   Prior to Admission medications   Medication Sig Start Date End Date Taking? Authorizing Provider  Chlorpheniramine Maleate (ALLERGY PO) Take 1 tablet by mouth daily as needed (seasonal allergies).    Historical Provider, MD  ibuprofen (ADVIL,MOTRIN) 600 MG tablet Take 1 tablet (600 mg total) by mouth every 6 (six) hours as needed for pain. 01/12/13   Marcellina Millinimothy Galey, MD  sucralfate (CARAFATE) 1 GM/10ML suspension Take 5 mLs (0.5 g total) by mouth 4 (four) times daily -  with meals and at bedtime. 12/23/14   Francee PiccoloJennifer Piepenbrink, PA-C   Allergies  Allergen Reactions  . Penicillins Hives, Nausea And Vomiting and Rash    FAMILY HISTORY:  has no family status information on file.    SOCIAL HISTORY:  reports that she has never smoked. She does not have any smokeless tobacco history on file. She reports that she does not drink alcohol or use illicit drugs.  REVIEW OF SYSTEMS:  Unable to complete as patient is altered on mechanical ventilation.   SUBJECTIVE:   VITAL SIGNS: Temp:  [98.2 F (36.8 C)-98.3 F (  36.8 C)] 98.2 F (36.8 C) (06/08 0623) Pulse Rate:  [84-137] 117 (06/08 0810) Resp:  [15-43] 19 (06/08 0810) BP: (104-177)/(47-117) 116/97 mmHg (06/08 0810) SpO2:  [96 %-100 %] 100 % (06/08 0810) FiO2 (%):  [100 %] 100 % (06/08 0750) Weight:  [323 lb (146.512 kg)] 323 lb (146.512 kg) (06/08 0623)   HEMODYNAMICS:     VENTILATOR SETTINGS: Vent Mode:  [-] PRVC FiO2 (%):  [100 %] 100 % Set Rate:  [16 bmp] 16 bmp Vt Set:  [500 mL-550 mL] 500 mL PEEP:  [5 cmH20] 5 cmH20 Plateau Pressure:  [29 cmH20] 29 cmH20   INTAKE / OUTPUT:  Intake/Output Summary (Last 24 hours) at 12/24/14 0827 Last data filed at 12/24/14 0454  Gross per 24 hour  Intake      0 ml  Output     15 ml  Net    -15 ml    PHYSICAL EXAMINATION: General:  Morbidly  obese female in NAD  Neuro:  Sedate on mechanical ventilation, spontaneous movement with stimulation, no nuchal rigidity  HEENT:  OETT, mm pink/moist Cardiovascular:  s1s2 rrr, no m/r/g Lungs:  resp's even/non-labored on vent, lungs bilaterally coarse Abdomen:  Obese/soft, bsx4 active  Musculoskeletal:  No acute deformities, 4pt restraints Skin:  Warm/dry, no overt edema  LABS:  CBC  Recent Labs Lab 12/24/14 0610  WBC 12.5*  HGB 11.9*  HCT 38.1  PLT 452*   Coag's No results for input(s): APTT, INR in the last 168 hours.   BMET  Recent Labs Lab 12/24/14 0610  NA 139  K 3.5  CL 102  CO2 19*  BUN 13  CREATININE 1.14*  GLUCOSE 116*   Electrolytes  Recent Labs Lab 12/24/14 0610  CALCIUM 9.1   Sepsis Markers No results for input(s): LATICACIDVEN, PROCALCITON, O2SATVEN in the last 168 hours.   ABG No results for input(s): PHART, PCO2ART, PO2ART in the last 168 hours.   Liver Enzymes  Recent Labs Lab 12/24/14 0610  AST 38  ALT 28  ALKPHOS 91  BILITOT 0.3  ALBUMIN 3.5   Cardiac Enzymes No results for input(s): TROPONINI, PROBNP in the last 168 hours.   Glucose  Recent Labs Lab 12/24/14 0627  GLUCAP 104*    Imaging Dg Chest Portable 1 View  12/24/2014   CLINICAL DATA:  19 year old female status post seizure, intubated. Initial encounter.  EXAM: PORTABLE CHEST - 1 VIEW  COMPARISON:  11/11/2007.  FINDINGS: Portable AP semi upright view at 0755 hours. Large body habitus. The tip of the endotracheal tube is just inside the right mainstem bronchus. Enteric tube also in place, loops in the left upper quadrant below the diaphragm.  Lower lung volumes with left greater than right perihilar opacity, air bronchograms on the left. No pneumothorax. Scoliosis. No acute osseous abnormality identified.  IMPRESSION: 1. Right mainstem bronchus intubation. Retract the ET tube 2 cm for more optimal placement. 2. Enteric tube loops at the level of the gastric fundus. 3.  Left greater than right pulmonary opacity might reflect a combination of atelectasis and aspiration in this setting. Study discussed by telephone with Dr. Mirian Mo on 12/24/2014 at 08:06 .   Electronically Signed   By: Odessa Fleming M.D.   On: 12/24/2014 08:06     ASSESSMENT / PLAN:  NEUROLOGIC A:   Acute Encephalopathy - ddx includes seizures, meningitis vs encephalitis.  Hx of oral lesions, seen in ER 6/7.   Seizures P:   RASS goal: -1 Propofol for sedation  with seizures PRN fentanyl for pain  LP per Neuro Keppra 500 mg IV BID (loaded in ER with 1000 mg) Seizure precautions Serial neuro exams  Neuro Consulted, appreciate input  PRN ativan for seizure   INFECTIOUS A:   R/O Meningitis vs Encephalitis  P:   BCx2 6/8 >>  UA 6/8 >>  Sputum 6/8 >>  UDS 6/8 >> neg   Hold abx / anti-virals for now.  Consider once CSF fluid obtained.    PULMONARY OETT 6/8 >>  A: Acute Respiratory Failure - in setting of AMS/Seizures  P:   MV support, 8cc/kg Wean PEEP/FiO2 for sats >92% CXR in am ABG now  CARDIOVASCULAR CVL A:  Sinus Tachycardia  Borderline QT interval - on admit 6/8 P:  ICU monitoring  EKG in am   RENAL A:   Acute Kidney Injury -  AG Metabolic Acidosis - ? Lactic acidosis in setting of seizures  P:   NS @ 45ml/hr Trend BMP / UOP  Replace electrolytes as indicated   GASTROINTESTINAL A:   Morbid Obesity P:   NPO  OGT  Consider TF in am 6/9 if remains intubated   HEMATOLOGIC A:   Anemia  P:  Trend CBC Heparin for DVT prophylaxis   ENDOCRINE A:   Mild Hyperglycemia - suspect stress response related P:   Monitor BMP glucose    FAMILY  - Updates: Mother updated at bedside.       Canary Brim, NP-C [facility] Pgr: [phone] or [phone]  12/24/2014, 8:27 AM   Reviewed above, examined.  19 yo female w/o significant medical hx was seen in ER for mouth sore and tongue swelling.  She was given carafate and took one dose of  naproxen.  She was not feeling well and had trouble comprehending things.  She was noted to have generalized seizure at home, with several recurrences in the ER.  She was intubated.  She has AKI and anion gap acidosis, mild increase in WBC.  No hx of bug bites, or other new medications.  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  No hx of ETOH or illicit drug use.  She is sedated on vent.  Heart rate regular, scattered crackles, abdomen soft, no edema, no rashes.  Labs, CXR from 6/08 reviewed.  Neuro to proceed with LP.  Defer antimicrobials until LP results reviewed since uncertain that she has infectious cause of seizures.  Continue AED's.  Continue full vent support until neuro status more stable.  D/w Dr. Thad Ranger and updated pts mother.  CC time by me independent of APP time is 35 minutes.  Coralyn Helling, MD [facility]monary[facility]Critical Care 12/24/2014, 10:18 AM Pager:  [phone] After 3pm call: [phone]

## 2014-12-24 NOTE — ED Notes (Signed)
Pt returned from CT with respiratory and this RN . Critical care at bedside.

## 2014-12-24 NOTE — Progress Notes (Signed)
Notified e-link doctor that lab has attempted twice to draw blood cultures and were unable to find access.  E-link doctor said that he would send hospital provider when available to evaluate patient for central line.  Also notified doctor of patient's decreasing blood pressure, orders received for 1 liter bolus.

## 2014-12-24 NOTE — ED Notes (Signed)
Pt moving all extremities unable to maintain patient and staff safety. Dr. Littie DeedsGentry given verbal ordered for medical restraints for all 4 extremities patient safety & intubation.

## 2014-12-24 NOTE — Progress Notes (Signed)
Hypoglycemic Event  CBG68  Treatment: D50 IV 25 mL  Symptoms: None  Follow-up CBG: Time:8:30 CBG Result:86  Possible Reasons for Event: Inadequate meal intake  Comments/MD notified:Sommers    Wende NeighborsMyers, Karren Newland T  Remember to initiate Hypoglycemia Order Set & complete

## 2014-12-24 NOTE — Progress Notes (Signed)
**Note De-identified  Obfuscation** Sputum collected and sent to lab 

## 2014-12-24 NOTE — ED Notes (Signed)
CBG 104 

## 2014-12-24 NOTE — Progress Notes (Signed)
Hypoglycemic Event  CBG: 55  Treatment: D50 IV 25 mL  Symptoms: None  Follow-up CBG: Time:1650 CBG Result: 102  Possible Reasons for Event: Other: npo  Comments/MD notified: Elink notified    Makayla Livingston  Remember to initiate Hypoglycemia Order Set & complete

## 2014-12-24 NOTE — ED Notes (Signed)
Placed pt on nasal cannula at 4L, per Dr. Littie DeedsGentry

## 2014-12-24 NOTE — Progress Notes (Signed)
eLink Physician-Brief Progress Note Patient Name: Lodema HongBriani S Windhorst DOB: October 03, 1995 MRN: [id number]   Date of Service  12/24/2014  HPI/Events of Note  Hypotension. SBP = 92.  eICU Interventions  Will bolus with 0.9 NaCl 1 liter IV over 1 hour now.      Intervention Category Intermediate Interventions: Hypotension - evaluation and management  Sommer,Steven Eugene 12/24/2014, 8:47 PM

## 2014-12-24 NOTE — Progress Notes (Signed)
Stat EEG completed; results pending.

## 2014-12-24 NOTE — Progress Notes (Signed)
RT note-pulled back ETT as ordered to 22cm

## 2014-12-24 NOTE — ED Notes (Signed)
Pt radial pulses 2+ and pedal pulses 2+. Skin warm and dry. Medically indicated restraint order placed.

## 2014-12-24 NOTE — ED Notes (Signed)
Dr. Reynolds at bedside.

## 2014-12-24 NOTE — ED Notes (Signed)
Pt very combative at this time, mom at the bedside, pt no following commands at this time, MD notified, Ativan 2mg  IV and Haldol 5 mg IV given.

## 2014-12-25 ENCOUNTER — Inpatient Hospital Stay (HOSPITAL_COMMUNITY): Payer: Medicaid Other

## 2014-12-25 DIAGNOSIS — R569 Unspecified convulsions: Principal | ICD-10-CM | POA: Diagnosis present

## 2014-12-25 LAB — COMPREHENSIVE METABOLIC PANEL
ALBUMIN: 2.8 g/dL — AB (ref 3.5–5.0)
ALT: 22 U/L (ref 14–54)
AST: 23 U/L (ref 15–41)
Alkaline Phosphatase: 75 U/L (ref 38–126)
Anion gap: 6 (ref 5–15)
BUN: <5 mg/dL — ABNORMAL LOW (ref 6–20)
CALCIUM: 8.1 mg/dL — AB (ref 8.9–10.3)
CO2: 21 mmol/L — ABNORMAL LOW (ref 22–32)
CREATININE: 0.73 mg/dL (ref 0.44–1.00)
Chloride: 111 mmol/L (ref 101–111)
GFR calc Af Amer: >60 mL/min (ref >60)
Glucose, Bld: 101 mg/dL — ABNORMAL HIGH (ref 65–99)
POTASSIUM: 3.3 mmol/L — AB (ref 3.5–5.1)
Sodium: 138 mmol/L (ref 135–145)
TOTAL PROTEIN: 5.7 g/dL — AB (ref 6.5–8.1)
Total Bilirubin: 0.4 mg/dL (ref 0.3–1.2)

## 2014-12-25 LAB — MAGNESIUM: Magnesium: 2 mg/dL (ref 1.7–2.4)

## 2014-12-25 LAB — CBC
HCT: 32 % — ABNORMAL LOW (ref 36.0–46.0)
Hemoglobin: 9.9 g/dL — ABNORMAL LOW (ref 12.0–15.0)
MCH: 22.9 pg — ABNORMAL LOW (ref 26.0–34.0)
MCHC: 30.9 g/dL (ref 30.0–36.0)
MCV: 74.1 fL — AB (ref 78.0–100.0)
PLATELETS: 315 10*3/uL (ref 150–400)
RBC: 4.32 MIL/uL (ref 3.87–5.11)
RDW: 14.9 % (ref 11.5–15.5)
WBC: 7.9 10*3/uL (ref 4.0–10.5)

## 2014-12-25 LAB — GLUCOSE, CAPILLARY
GLUCOSE-CAPILLARY: 79 mg/dL (ref 65–99)
GLUCOSE-CAPILLARY: 81 mg/dL (ref 65–99)
GLUCOSE-CAPILLARY: 85 mg/dL (ref 65–99)
GLUCOSE-CAPILLARY: 90 mg/dL (ref 65–99)
Glucose-Capillary: 68 mg/dL (ref 65–99)
Glucose-Capillary: 100 mg/dL — ABNORMAL HIGH (ref 65–99)
Glucose-Capillary: 92 mg/dL (ref 65–99)
Glucose-Capillary: 82 mg/dL (ref 65–99)
Glucose-Capillary: 109 mg/dL — ABNORMAL HIGH (ref 65–99)

## 2014-12-25 LAB — BLOOD GAS, ARTERIAL
ACID-BASE DEFICIT: 4.2 mmol/L — AB (ref 0.0–2.0)
Bicarbonate: 20.6 mEq/L (ref 20.0–24.0)
Drawn by: 418751
FIO2: 0.5 %
MECHVT: 500 mL
O2 Saturation: 99.4 %
PCO2 ART: 39.2 mmHg (ref 35.0–45.0)
PEEP: 5 cmH2O
PH ART: 7.34 — AB (ref 7.350–7.450)
PO2 ART: 183 mmHg — AB (ref 80.0–100.0)
Patient temperature: 98.1
RATE: 16 resp/min
TCO2: 21.9 mmol/L (ref 0–100)

## 2014-12-25 LAB — HERPES SIMPLEX VIRUS(HSV) DNA BY PCR
HSV 1 DNA: NEGATIVE
HSV 2 DNA: NEGATIVE

## 2014-12-25 LAB — URINE CULTURE
CULTURE: NO GROWTH
Colony Count: NO GROWTH

## 2014-12-25 LAB — PHOSPHORUS: Phosphorus: 3.4 mg/dL (ref 2.5–4.6)

## 2014-12-25 LAB — TRIGLYCERIDES: Triglycerides: 74 mg/dL (ref <150)

## 2014-12-25 MED ORDER — DEXTROSE 50 % IV SOLN
25.0000 mL | Freq: Once | INTRAVENOUS | Status: AC
  Administered 2014-12-25: 25 mL via INTRAVENOUS

## 2014-12-25 MED ORDER — ENOXAPARIN SODIUM 40 MG/0.4ML ~~LOC~~ SOLN
40.0000 mg | SUBCUTANEOUS | Status: DC
  Administered 2014-12-25: 40 mg via SUBCUTANEOUS
  Filled 2014-12-25 (×2): qty 0.4

## 2014-12-25 MED ORDER — DEXTROSE 50 % IV SOLN
25.0000 mL | Freq: Once | INTRAVENOUS | Status: AC
  Administered 2014-12-24: 25 mL via INTRAVENOUS

## 2014-12-25 MED ORDER — POTASSIUM CHLORIDE 20 MEQ/15ML (10%) PO SOLN
40.0000 meq | Freq: Once | ORAL | Status: AC
  Administered 2014-12-25: 40 meq
  Filled 2014-12-25: qty 30

## 2014-12-25 NOTE — Progress Notes (Signed)
Hypoglycemic Event  CBG:68  Treatment: D50 IV 25 mL  Symptoms: None  Follow-up CBG: Time:12:59 CBG Result81  Possible Reasons for Event: Inadequate meal intake  Comments/MD notified:E-link; orders received to increase D10 rate    Wende Neighbors T  Remember to initiate Hypoglycemia Order Set & complete

## 2014-12-25 NOTE — Progress Notes (Signed)
Verbal orders received from [facility] that central line is safe to use.

## 2014-12-25 NOTE — Progress Notes (Signed)
Subjective: Patient remains intubated and sedated.  Per nursing when sedation decreased the patient was able to open her eyes and follow commands.  Sedation now increased again.  No further seizures noted.  Patient remains on Keppra.    Objective: Current vital signs: BP 98/51 mmHg  Pulse 85  Temp(Src) 97.9 F (36.6 C) (Oral)  Resp 17  Ht  (1.702 m)  Wt 166.924 kg (368 lb)  BMI 57.62 kg/m2  SpO2 100%  LMP 12/22/2014 Vital signs in last 24 hours: Temp:  [97.5 F (36.4 C)-99 F (37.2 C)] 97.9 F (36.6 C) (06/09 1115) Pulse Rate:  [68-99] 85 (06/09 1200) Resp:  [11-43] 17 (06/09 1200) BP: (87-147)/(47-85) 98/51 mmHg (06/09 1200) SpO2:  [100 %] 100 % (06/09 1200) FiO2 (%):  [40 %-50 %] 40 % (06/09 1200) Weight:  [166.924 kg (368 lb)] 166.924 kg (368 lb) (06/09 0500)  Intake/Output from previous day: 06/08 0701 - 06/09 0700 In: 4240.5 [I.V.:2880.5; NG/GT:150; IV Piggyback:1210] Out: 2420 [Urine:2420] Intake/Output this shift: Total I/O In: 844.6 [I.V.:814.6; NG/GT:30] Out: 850 [Urine:850] Nutritional status: Diet NPO time specified  Neurologic Exam: Mental Status: Patient does not respond to verbal stimuli. No response to deep sternal rub. Does not follow commands. No verbalizations noted.  Cranial Nerves: II: patient does not respond confrontation bilaterally, pupils right 2 mm, left 2 mm,and minimally reactive bilaterally III,IV,VI: doll's response absent bilaterally.  V,VII: corneal reflex reduced bilaterally  VIII: patient does not respond to verbal stimuli IX,X: gag reflex reduced, XI: trapezius strength unable to test bilaterally XII: tongue strength unable to test Motor: Extremities flaccid throughout. No spontaneous movement noted. No purposeful movements noted. Sensory: Responds to noxious stimuli in all extremities. Deep Tendon Reflexes:  Absent throughout. Plantars: mute bilaterally   Lab Results: Basic Metabolic Panel:  Recent Labs Lab  12/24/14 0610 12/25/14 0253  NA 139 138  K 3.5 3.3*  CL 102 111  CO2 19* 21*  GLUCOSE 116* 101*  BUN 13 <5*  CREATININE 1.14* 0.73  CALCIUM 9.1 8.1*  MG  --  2.0  PHOS  --  3.4    Liver Function Tests:  Recent Labs Lab 12/24/14 0610 12/25/14 0253  AST 38 23  ALT 28 22  ALKPHOS 91 75  BILITOT 0.3 0.4  PROT 7.9 5.7*  ALBUMIN 3.5 2.8*   No results for input(s): LIPASE, AMYLASE in the last 168 hours. No results for input(s): AMMONIA in the last 168 hours.  CBC:  Recent Labs Lab 12/24/14 0610 12/25/14 0253  WBC 12.5* 7.9  NEUTROABS 8.7*  --   HGB 11.9* 9.9*  HCT 38.1 32.0*  MCV 75.3* 74.1*  PLT 452* 315    Cardiac Enzymes: No results for input(s): CKTOTAL, CKMB, CKMBINDEX, TROPONINI in the last 168 hours.  Lipid Panel:  Recent Labs Lab 12/25/14 0217  TRIG 74    CBG:  Recent Labs Lab 12/25/14 0057 12/25/14 0223 12/25/14 0311 12/25/14 0822 12/25/14 1112  GLUCAP 81 100* 92 90 79    Microbiology: Results for orders placed or performed during the hospital encounter of 12/24/14  MRSA PCR Screening     Status: None   Collection Time: 12/24/14 11:28 AM  Result Value Ref Range Status   MRSA by PCR NEGATIVE NEGATIVE Final    Comment:        The GeneXpert MRSA Assay (FDA approved for NASAL specimens only), is one component of a comprehensive MRSA colonization surveillance program. It is not intended to diagnose MRSA infection nor  to guide or monitor treatment for MRSA infections.   Urine culture     Status: None   Collection Time: 12/24/14 11:31 AM  Result Value Ref Range Status   Specimen Description URINE, CATHETERIZED  Final   Special Requests NONE  Final   Colony Count NO GROWTH Performed at [facility]   Final   Culture NO GROWTH Performed at [facility]   Final   Report Status 12/25/2014 FINAL  Final  Gram stain     Status: None   Collection Time: 12/24/14  3:27 PM  Result Value Ref Range Status   Specimen  Description CSF  Final   Special Requests NONE  Final   Gram Stain   Final    WBC PRESENT, PREDOMINANTLY MONONUCLEAR NO ORGANISMS SEEN CYTOSPIN SLIDE    Report Status 12/24/2014 FINAL  Final  CSF culture     Status: None (Preliminary result)   Collection Time: 12/24/14  3:27 PM  Result Value Ref Range Status   Specimen Description CSF  Final   Special Requests NONE  Final   Gram Stain   Final    WBC PRESENT, PREDOMINANTLY MONONUCLEAR NO ORGANISMS SEEN CYTOSPIN Performed at [facility] Performed at [facility]    Culture NO GROWTH Performed at [facility]   Final   Report Status PENDING  Incomplete  Culture, respiratory (tracheal aspirate)     Status: None (Preliminary result)   Collection Time: 12/24/14  4:17 PM  Result Value Ref Range Status   Specimen Description TRACHEAL ASPIRATE  Final   Special Requests NONE  Final   Gram Stain   Final    FEW WBC PRESENT,BOTH PMN AND MONONUCLEAR RARE SQUAMOUS EPITHELIAL CELLS PRESENT FEW GRAM POSITIVE COCCI IN PAIRS IN CHAINS IN CLUSTERS RARE GRAM NEGATIVE COCCI Performed at [facility]    Culture   Final    Culture reincubated for better growth Performed at [facility]    Report Status PENDING  Incomplete    Coagulation Studies: No results for input(s): LABPROT, INR in the last 72 hours.  Imaging: Ct Head Wo Contrast  12/24/2014   CLINICAL DATA:  Seizure. Initial encounter. Patient intubated following seizure.  EXAM: CT HEAD WITHOUT CONTRAST  TECHNIQUE: Contiguous axial images were obtained from the base of the skull through the vertex without intravenous contrast.  COMPARISON:  None.  FINDINGS: Nonstandard scan plane was used because of obese body habitus and intubation. Endotracheal tube and probable orogastric tube present in the oropharynx. Debris is present in the posterior pharynx. Piercing is present over the RIGHT mandible.  No mass lesion, mass effect, midline shift,  hydrocephalus, hemorrhage. No territorial ischemia or acute infarction. The calvarium is intact. Frontal sinuses and ethmoid air cells appear within normal limits. Over the anterior frontal lobes, there is slightly higher attenuation is most compatible with beam hardening artifact. This artifact is accentuated by the imaging plane.  IMPRESSION: 1. No acute intracranial abnormality. 2. Nonstandard imaging plane secondary to body habitus.   Electronically Signed   By: Andreas Newport M.D.   On: 12/24/2014 09:10   Dg Chest Port 1 View  12/25/2014   CLINICAL DATA:  Central line placement  EXAM: PORTABLE CHEST - 1 VIEW  COMPARISON:  12/24/2014 at 15:39  FINDINGS: The endotracheal tube tip is 4.1 cm above the carina. There is a new left jugular central line extending into the low SVC. Nasogastric tube extends into the stomach. There is no pneumothorax.  There is no confluent airspace consolidation. There is no large effusion.  IMPRESSION: New left jugular central line extends into the low SVC. No pneumothorax.   Electronically Signed   By: Ellery Plunk M.D.   On: 12/25/2014 02:10   Dg Chest Port 1 View  12/24/2014   CLINICAL DATA:  Acute respiratory failure. Seizures. Altered mental status.  EXAM: PORTABLE CHEST - 1 VIEW  COMPARISON:  12/24/2014 at 0755 hours.  FINDINGS: Support apparatus: Endotracheal tube tip 3.7 cm from the carina. The tip is at the level of the clavicles. Enteric tube is present with the tip near the cardia of the stomach.  Cardiomediastinal Silhouette:  Unchanged allowing for projection.  Lungs: Increasing opacification in the RIGHT upper lobe. No pneumothorax.  Effusions:  None.  Other:  None.  IMPRESSION: 1. Retraction of endotracheal tube, now 3.7 cm from the carina. 2. Lower lung volumes with opacity in the RIGHT upper lobe, probably representing atelectasis although airspace disease could also have this appearance.   Electronically Signed   By: Andreas Newport M.D.   On: 12/24/2014 15:51    Dg Chest Portable 1 View  12/24/2014   CLINICAL DATA:  19 year old female status post seizure, intubated. Initial encounter.  EXAM: PORTABLE CHEST - 1 VIEW  COMPARISON:  11/11/2007.  FINDINGS: Portable AP semi upright view at 0755 hours. Large body habitus. The tip of the endotracheal tube is just inside the right mainstem bronchus. Enteric tube also in place, loops in the left upper quadrant below the diaphragm.  Lower lung volumes with left greater than right perihilar opacity, air bronchograms on the left. No pneumothorax. Scoliosis. No acute osseous abnormality identified.  IMPRESSION: 1. Right mainstem bronchus intubation. Retract the ET tube 2 cm for more optimal placement. 2. Enteric tube loops at the level of the gastric fundus. 3. Left greater than right pulmonary opacity might reflect a combination of atelectasis and aspiration in this setting. Study discussed by telephone with Dr. Mirian Mo on 12/24/2014 at 08:06 .   Electronically Signed   By: Odessa Fleming M.D.   On: 12/24/2014 08:06   Dg Fluoro Guide Lumbar Puncture  12/24/2014   CLINICAL DATA:  19 year old female with seizure, recent diagnosis of Herpangina. Unsuccessful bedside lumbar puncture. Initial encounter.  EXAM: DIAGNOSTIC LUMBAR PUNCTURE UNDER FLUOROSCOPIC GUIDANCE  FLUOROSCOPY TIME:  Radiation Exposure Index (as provided by the fluoroscopic device):  If the device does not provide the exposure index:  Fluoroscopy Time (in minutes and seconds):  0 minutes 30 seconds  Number of Acquired Images:  2  PROCEDURE: Informed consent was obtained via the telephone from the patient's mother. A "time-out" was performed.  The patient was already intubated. She was placed prone, the lower back was prepped with Betadine.  1% Lidocaine was used for local anesthesia. Lumbar puncture was performed at the L2-L3 level using a 5 inch 20 knee gauge needle with return of clear, colorless CSF with an opening pressure of 15 cm water. 14 mL of CSF were obtained  for laboratory studies.  The patient tolerated the procedure well and there were no apparent complications.  IMPRESSION: Fluoroscopic guided lumbar puncture. Clear CSF and normal opening pressure. 14 mL of CSF collected for lab analysis.   Electronically Signed   By: Odessa Fleming M.D.   On: 12/24/2014 15:52    Medications:  I have reviewed the patient's current medications. Scheduled: . antiseptic oral rinse  7 mL Mouth Rinse QID  . chlorhexidine  15 mL Mouth  Rinse BID  . enoxaparin (LOVENOX) injection  40 mg Subcutaneous Q24H  . levETIRAcetam  500 mg Intravenous Q12H  . LORazepam  2 mg Intravenous Once  . pantoprazole (PROTONIX) IV  40 mg Intravenous QHS    Assessment/Plan: 19 year old presenting with new onset seizures.  On Keppra and no further seizures noted.  Intubated and sedated.  EEG performed on yesterday showed focal right frontal slowing and sharp activity but no evidence of subclinical seizure activity.  Head CT unremarkable.  LP was not indicative of infection with 3 rbc's, 2 wbc's, normal protein and glucose.  HSV pending.  Magnesium and phosphorus are normal.  WBC count normal.  Elevation on yesterday likely a result of seizure activity.    Recommendations: 1.  MRI of the brain with and without contrast. 2.  Continue Keppra at current dose.    LOS: 1 day   Thana Farr, MD [facility] [phone] 12/25/2014  12:38 PM

## 2014-12-25 NOTE — Progress Notes (Signed)
When patient was weighed on the bariatric bed, her weight was 166.9kg which is a 14% increase from the weight listed from the ED (146.5kg).

## 2014-12-25 NOTE — Progress Notes (Signed)
PULMONARY / CRITICAL CARE MEDICINE   Name: Makayla Livingston MRN: [id number] DOB: 09/07/1995    ADMISSION DATE:  12/24/2014 CONSULTATION DATE:  12/24/14  REFERRING MD :  Dr. Littie Deeds   CHIEF COMPLAINT:  AMS/Seizures   INITIAL PRESENTATION: 19 y/o F with no known PMH who presented to [facility] ER on 6/8 with AMS.  Developed seizures in ER and subsequently intubated.    STUDIES:  6/08 Head CT >> no acute abnormality  6/8 EEG >  focal right frontal slowing and sharp activity but no evidence of subclinical seizure activity  SIGNIFICANT EVENTS: 6/07 Seen in ER for oral lesions, treated with carafate & d/c'd 6/08  Admitted to [facility] for AMS, developed seizures in ER and intubated.     HISTORY OF PRESENT ILLNESS:  19 y/o F with no known PMH who on 6/7 presented to ER with complaints of sores on her tongue that began 6/6 with associated mouth pain.  She was treated with carafate and discharged.    At baseline, she lives with her mother and is a Consulting civil engineer at [employer]  She is studying biochemistry and psychology.  Mother denies known drug use and medical hx.  The patient has one brother and he is healthy.  No known bub bites / wounds.  Non-smoker.   Mother reports on 6/6 the patient didn't feel well at school and left early. She slept most of they day.  Then was seen in ER on 6/7.  The patient went home and was in her usual state of health.  The mother reports the am of 6/8 she woke to a loud noise and found her daughter on the floor with a bookshelf on top of her.  She was altered and unable to re-orient her.  EMS was activated and was witnessed to have 2 seizures.  In the ER, she had 4 more generalized full body seizures.  She was treated with IV ativan and haldol for agitation.  She continued to have seizures and decision was made to intubate for airway protection.  She had a total of 7 seizures between EMS and in the ER since presentation.  The patient had significant agitation requiring 4 point restraints.  Placed on  propofol and CT head evaluated which was negative.  Mother denies known fevers / chills, sick exposures.  PCCM called for ICU admission.    SUBJECTIVE: Afebrile Nonpurposeful agitation on wakeup assessment Good urine output  VITAL SIGNS: Temp:  [97.5 F (36.4 C)-99 F (37.2 C)] 97.9 F (36.6 C) (06/09 1115) Pulse Rate:  [68-99] 85 (06/09 1200) Resp:  [11-43] 17 (06/09 1200) BP: (87-147)/(47-85) 98/51 mmHg (06/09 1200) SpO2:  [100 %] 100 % (06/09 1200) FiO2 (%):  [40 %-50 %] 40 % (06/09 1200) Weight:  [368 lb (166.924 kg)] 368 lb (166.924 kg) (06/09 0500)   HEMODYNAMICS:     VENTILATOR SETTINGS: Vent Mode:  [-] PRVC FiO2 (%):  [40 %-50 %] 40 % Set Rate:  [16 bmp] 16 bmp Vt Set:  [500 mL] 500 mL PEEP:  [5 cmH20] 5 cmH20 Plateau Pressure:  [21 cmH20-27 cmH20] 24 cmH20   INTAKE / OUTPUT:  Intake/Output Summary (Last 24 hours) at 12/25/14 1321 Last data filed at 12/25/14 1200  Gross per 24 hour  Intake 4817.08 ml  Output   3155 ml  Net 1662.08 ml    PHYSICAL EXAMINATION: General:  Morbidly obese female in NAD  Neuro:  Sedate on mechanical ventilation, spontaneous movement with stimulation, no nuchal rigidity  HEENT:  OETT, mm pink/moist Cardiovascular:  s1s2 rrr, no m/r/g Lungs:  resp's even/non-labored on vent, lungs bilaterally coarse Abdomen:  Obese/soft, bsx4 active  Musculoskeletal:  No acute deformities, 4pt restraints Skin:  Warm/dry, no overt edema  LABS:  CBC  Recent Labs Lab 12/24/14 0610 12/25/14 0253  WBC 12.5* 7.9  HGB 11.9* 9.9*  HCT 38.1 32.0*  PLT 452* 315   Coag's No results for input(s): APTT, INR in the last 168 hours.   BMET  Recent Labs Lab 12/24/14 0610 12/25/14 0253  NA 139 138  K 3.5 3.3*  CL 102 111  CO2 19* 21*  BUN 13 <5*  CREATININE 1.14* 0.73  GLUCOSE 116* 101*   Electrolytes  Recent Labs Lab 12/24/14 0610 12/25/14 0253  CALCIUM 9.1 8.1*  MG  --  2.0  PHOS  --  3.4   Sepsis Markers No results for  input(s): LATICACIDVEN, PROCALCITON, O2SATVEN in the last 168 hours.   ABG  Recent Labs Lab 12/24/14 1048 12/25/14 0417  PHART 7.291* 7.340*  PCO2ART 42.9 39.2  PO2ART 393.0* 183*     Liver Enzymes  Recent Labs Lab 12/24/14 0610 12/25/14 0253  AST 38 23  ALT 28 22  ALKPHOS 91 75  BILITOT 0.3 0.4  ALBUMIN 3.5 2.8*   Cardiac Enzymes No results for input(s): TROPONINI, PROBNP in the last 168 hours.   Glucose  Recent Labs Lab 12/25/14 0026 12/25/14 0057 12/25/14 0223 12/25/14 0311 12/25/14 0822 12/25/14 1112  GLUCAP 68 81 100* 92 90 79    Imaging Dg Chest Port 1 View  12/25/2014   CLINICAL DATA:  Central line placement  EXAM: PORTABLE CHEST - 1 VIEW  COMPARISON:  12/24/2014 at 15:39  FINDINGS: The endotracheal tube tip is 4.1 cm above the carina. There is a new left jugular central line extending into the low SVC. Nasogastric tube extends into the stomach. There is no pneumothorax. There is no confluent airspace consolidation. There is no large effusion.  IMPRESSION: New left jugular central line extends into the low SVC. No pneumothorax.   Electronically Signed   By: Ellery Plunk M.D.   On: 12/25/2014 02:10   Dg Chest Port 1 View  12/24/2014   CLINICAL DATA:  Acute respiratory failure. Seizures. Altered mental status.  EXAM: PORTABLE CHEST - 1 VIEW  COMPARISON:  12/24/2014 at 0755 hours.  FINDINGS: Support apparatus: Endotracheal tube tip 3.7 cm from the carina. The tip is at the level of the clavicles. Enteric tube is present with the tip near the cardia of the stomach.  Cardiomediastinal Silhouette:  Unchanged allowing for projection.  Lungs: Increasing opacification in the RIGHT upper lobe. No pneumothorax.  Effusions:  None.  Other:  None.  IMPRESSION: 1. Retraction of endotracheal tube, now 3.7 cm from the carina. 2. Lower lung volumes with opacity in the RIGHT upper lobe, probably representing atelectasis although airspace disease could also have this appearance.    Electronically Signed   By: Andreas Newport M.D.   On: 12/24/2014 15:51   Dg Fluoro Guide Lumbar Puncture  12/24/2014   CLINICAL DATA:  19 year old female with seizure, recent diagnosis of Herpangina. Unsuccessful bedside lumbar puncture. Initial encounter.  EXAM: DIAGNOSTIC LUMBAR PUNCTURE UNDER FLUOROSCOPIC GUIDANCE  FLUOROSCOPY TIME:  Radiation Exposure Index (as provided by the fluoroscopic device):  If the device does not provide the exposure index:  Fluoroscopy Time (in minutes and seconds):  0 minutes 30 seconds  Number of Acquired Images:  2  PROCEDURE: Informed consent was  obtained via the telephone from the patient's mother. A "time-out" was performed.  The patient was already intubated. She was placed prone, the lower back was prepped with Betadine.  1% Lidocaine was used for local anesthesia. Lumbar puncture was performed at the L2-L3 level using a 5 inch 20 knee gauge needle with return of clear, colorless CSF with an opening pressure of 15 cm water. 14 mL of CSF were obtained for laboratory studies.  The patient tolerated the procedure well and there were no apparent complications.  IMPRESSION: Fluoroscopic guided lumbar puncture. Clear CSF and normal opening pressure. 14 mL of CSF collected for lab analysis.   Electronically Signed   By: Odessa Fleming M.D.   On: 12/24/2014 15:52     ASSESSMENT / PLAN:  NEUROLOGIC A:   Acute Encephalopathy - ddx includes seizures, meningitis vs encephalitis.  Hx of oral lesions, seen in ER 6/7.   Seizures P:   RASS goal: 0 Propofol for sedation with seizures PRN fentanyl for pain  Keppra 500 mg IV BID (loaded in ER with 1000 mg) Seizure precautions, PRN ativan for seizure  Neuro Consulted, appreciate input  MRI today   INFECTIOUS A:   Doubt Meningitis or Encephalitis given clear CSF P:   BCx2 6/8 >>  UA 6/8 >>  Sputum 6/8 >>  UDS 6/8 >> neg   Hold abx / anti-virals for now.  Consider once CSF fluid obtained.    PULMONARY OETT 6/8 >>   A: Acute Respiratory Failure - in setting of AMS/Seizures  P:   SBTs after MRI   CARDIOVASCULAR CVL A:  Sinus Tachycardia  Borderline QT interval - on admit 6/8, improved 6/9 P:  ICU monitoring   RENAL A:   Acute Kidney Injury - resolved AG Metabolic Acidosis - ? Lactic acidosis in setting of seizures , resolved Hypokalemia P:   NS @ 42ml/hr Trend BMP / UOP  Replace electrolytes as indicated   GASTROINTESTINAL A:   Morbid Obesity P:   NPO  Consider TF in am 6/10 if remains intubated   HEMATOLOGIC A:   Anemia  P:  Trend CBC Heparin for DVT prophylaxis   ENDOCRINE A:   Mild Hyperglycemia - suspect stress response related P:   Monitor BMP glucose    FAMILY  - Updates: Mother updated at bedside.    Summary - new onset seizures, clear CSF makes infection unlikely, given focal EEG-we'll proceed with MRI before extubating.   The patient is critically ill with multiple organ systems failure and requires high complexity decision making for assessment and support, frequent evaluation and titration of therapies, application of advanced monitoring technologies and extensive interpretation of multiple databases. Critical Care Time devoted to patient care services described in this note independent of APP time is 35 minutes.   Cyril Mourning MD. Tonny Bollman. [facility] Pager [phone] If no response call [phone]   12/25/2014, 1:21 PM

## 2014-12-25 NOTE — Progress Notes (Signed)
Transported pt to MRI with respiratory. Unable to perform MRI due to pt's anatomy.  Notified Dr Thad Ranger, notified family. Transported back to ICU.

## 2014-12-25 NOTE — Procedures (Signed)
Central Venous Catheter Insertion Procedure Note DORIAN CUTTS [id number] 12/05/1995  Procedure: Insertion of Central Venous Catheter Indications: Drug and/or fluid administration and Frequent blood sampling  Procedure Details Consent: Risks of procedure as well as the alternatives and risks of each were explained to the (patient/caregiver).  Consent for procedure obtained. Time Out: Verified patient identification, verified procedure, site/side was marked, verified correct patient position, special equipment/implants available, medications/allergies/relevent history reviewed, required imaging and test results available.  Performed  Maximum sterile technique was used including antiseptics, cap, gloves, gown, hand hygiene, mask and sheet. Skin prep: Chlorhexidine; local anesthetic administered A antimicrobial bonded/coated triple lumen catheter was placed in the left internal jugular vein using the Seldinger technique. Ultrasound guidance used.Yes.   Catheter placed to 22 cm. Blood aspirated via all 3 ports and then flushed x 3. Line sutured x 2 and dressing applied.  Evaluation Blood flow good Complications: No apparent complications Patient did tolerate procedure well. Chest X-ray ordered to verify placement.  CXR: pending.  Joneen Roach, AGACNP-BC [facility] Pulmonology/Critical Care Pager [phone] or [phone]  12/25/2014 12:15 AM

## 2014-12-26 DIAGNOSIS — J9601 Acute respiratory failure with hypoxia: Secondary | ICD-10-CM

## 2014-12-26 DIAGNOSIS — J9600 Acute respiratory failure, unspecified whether with hypoxia or hypercapnia: Secondary | ICD-10-CM | POA: Diagnosis present

## 2014-12-26 LAB — BASIC METABOLIC PANEL
ANION GAP: 8 (ref 5–15)
CALCIUM: 8.2 mg/dL — AB (ref 8.9–10.3)
CO2: 24 mmol/L (ref 22–32)
CREATININE: 0.8 mg/dL (ref 0.44–1.00)
Chloride: 108 mmol/L (ref 101–111)
GFR calc Af Amer: >60 mL/min (ref >60)
GFR calc non Af Amer: >60 mL/min (ref >60)
Glucose, Bld: 106 mg/dL — ABNORMAL HIGH (ref 65–99)
POTASSIUM: 3.3 mmol/L — AB (ref 3.5–5.1)
Sodium: 140 mmol/L (ref 135–145)

## 2014-12-26 LAB — CBC
HCT: 31.9 % — ABNORMAL LOW (ref 36.0–46.0)
HEMOGLOBIN: 10 g/dL — AB (ref 12.0–15.0)
MCH: 23.3 pg — ABNORMAL LOW (ref 26.0–34.0)
MCHC: 31.3 g/dL (ref 30.0–36.0)
MCV: 74.2 fL — AB (ref 78.0–100.0)
Platelets: 322 10*3/uL (ref 150–400)
RBC: 4.3 MIL/uL (ref 3.87–5.11)
RDW: 15.2 % (ref 11.5–15.5)
WBC: 7.8 10*3/uL (ref 4.0–10.5)

## 2014-12-26 LAB — GLUCOSE, CAPILLARY
GLUCOSE-CAPILLARY: 102 mg/dL — AB (ref 65–99)
GLUCOSE-CAPILLARY: 118 mg/dL — AB (ref 65–99)
GLUCOSE-CAPILLARY: 93 mg/dL (ref 65–99)
GLUCOSE-CAPILLARY: 89 mg/dL (ref 65–99)
Glucose-Capillary: 106 mg/dL — ABNORMAL HIGH (ref 65–99)

## 2014-12-26 MED ORDER — VANCOMYCIN HCL 10 G IV SOLR
1500.0000 mg | Freq: Three times a day (TID) | INTRAVENOUS | Status: DC
  Administered 2014-12-26 – 2014-12-29 (×8): 1500 mg via INTRAVENOUS
  Filled 2014-12-26 (×11): qty 1500

## 2014-12-26 MED ORDER — CETYLPYRIDINIUM CHLORIDE 0.05 % MT LIQD: 7.0000 mL | Freq: Two times a day (BID) | OROMUCOSAL | Status: DC

## 2014-12-26 MED ORDER — ENOXAPARIN SODIUM 80 MG/0.8ML ~~LOC~~ SOLN
80.0000 mg | SUBCUTANEOUS | Status: DC
  Administered 2014-12-26 – 2014-12-29 (×4): 80 mg via SUBCUTANEOUS
  Filled 2014-12-26 (×4): qty 0.8

## 2014-12-26 MED ORDER — POTASSIUM CHLORIDE 20 MEQ/15ML (10%) PO SOLN
20.0000 meq | ORAL | Status: AC
  Administered 2014-12-26 (×2): 20 meq
  Filled 2014-12-26 (×2): qty 15

## 2014-12-26 MED ORDER — VANCOMYCIN HCL 10 G IV SOLR
2500.0000 mg | Freq: Once | INTRAVENOUS | Status: AC
  Administered 2014-12-26: 2500 mg via INTRAVENOUS
  Filled 2014-12-26: qty 2500

## 2014-12-26 NOTE — Progress Notes (Signed)
[facility] ICU Electrolyte Replacement Protocol  Patient Name: Makayla Livingston DOB: 28-Sep-1995 MRN: [id number]  Date of Service  12/26/2014   HPI/Events of Note    Recent Labs Lab 12/24/14 0610 12/25/14 0253 12/26/14 0355  NA 139 138 140  K 3.5 3.3* 3.3*  CL 102 111 108  CO2 19* 21* 24  GLUCOSE 116* 101* 106*  BUN 13 <5* <5*  CREATININE 1.14* 0.73 0.80  CALCIUM 9.1 8.1* 8.2*  MG  --  2.0  --   PHOS  --  3.4  --     Estimated Creatinine Clearance: 184.5 mL/min (by C-G formula based on Cr of 0.8).  Intake/Output      06/09 0701 - 06/10 0700   I.V. (mL/kg) 3687.6 (22.2)   NG/GT 180   IV Piggyback 105   Total Intake(mL/kg) 3972.6 (23.9)   Urine (mL/kg/hr) 2650 (0.7)   Emesis/NG output 300 (0.1)   Total Output 2950   Net +1022.6        - I/O DETAILED x24h    Total I/O In: 1780 [I.V.:1690; NG/GT:90] Out: 1550 [Urine:1250; Emesis/NG output:300] - I/O THIS SHIFT    ASSESSMENT   eICURN Interventions  ICU Electrolyte Replacement Protocol criteria met. Labs Replaced per protocol. MD notified   ASSESSMENT: MAJOR ELECTROLYTE    Makayla Livingston 12/26/2014, 5:28 AM

## 2014-12-26 NOTE — Procedures (Signed)
Extubation Procedure Note  Patient Details:   Name: MANDEEP MATZA DOB: 1996-01-02 MRN: [id number]   Airway Documentation:     Evaluation  O2 sats: stable throughout Complications: No apparent complications Patient did tolerate procedure well. Bilateral Breath Sounds: Clear, Diminished Suctioning: Airway Yes   Pt has some hoarseness with vocalization and mild tongue swelling, IS instructed, pt has some difficulty closing her lips around the mouthpiece, Pt placed on Jeannette 4 lpm  Rayburn Felt 12/26/2014, 11:15 AM

## 2014-12-26 NOTE — Progress Notes (Addendum)
ANTIBIOTIC CONSULT NOTE - INITIAL  Pharmacy Consult for vancomycin Indication: rule out bacteremia  Allergies  Allergen Reactions  . Penicillins Hives, Nausea And Vomiting and Rash  . Pollen Extract Other (See Comments)    Seasonal allergies    Patient Measurements: Height: 5\' 7"  (170.2 cm) Weight: (!) 365 lb 11.9 oz (165.9 kg) IBW/kg (Calculated) : 61.6 Adjusted Body Weight:   Vital Signs: Temp: 99.1 F (37.3 C) (06/10 0732) Temp Source: Oral (06/10 0732) BP: 102/79 mmHg (06/10 0849) Pulse Rate: 105 (06/10 0849) Intake/Output from previous day: 06/09 0701 - 06/10 0700 In: 4485.8 [I.V.:4015.8; NG/GT:260; IV Piggyback:210] Out: 3180 [Urine:2880; Emesis/NG output:300] Intake/Output from this shift: Total I/O In: 30 [NG/GT:30] Out: 400 [Urine:300; Emesis/NG output:100]  Labs:  Recent Labs  12/24/14 0610 12/25/14 0253 12/26/14 0355  WBC 12.5* 7.9 7.8  HGB 11.9* 9.9* 10.0*  PLT 452* 315 322  CREATININE 1.14* 0.73 0.80   Estimated Creatinine Clearance: 184.5 mL/min (by C-G formula based on Cr of 0.8). No results for input(s): VANCOTROUGH, VANCOPEAK, VANCORANDOM, GENTTROUGH, GENTPEAK, GENTRANDOM, TOBRATROUGH, TOBRAPEAK, TOBRARND, AMIKACINPEAK, AMIKACINTROU, AMIKACIN in the last 72 hours.   Medical History: History reviewed. No pertinent past medical history.  Medications:  Prescriptions prior to admission  Medication Sig Dispense Refill Last Dose  . naproxen (NAPROSYN) 500 MG tablet Take 500 mg by mouth daily.   12/23/2014 at Unknown time  . sucralfate (CARAFATE) 1 GM/10ML suspension Take 5 mLs (0.5 g total) by mouth 4 (four) times daily -  with meals and at bedtime. 420 mL 0 12/23/2014 at Unknown time  . ibuprofen (ADVIL,MOTRIN) 600 MG tablet Take 1 tablet (600 mg total) by mouth every 6 (six) hours as needed for pain. (Patient not taking: Reported on 12/24/2014) 30 tablet 0 Not Taking at Unknown time   Assessment: 19 yo female admitted with altered mental status and  seizures. Initiating abx for GPC in 1/2 blood cx. WBC 7.8, afeb, no PCT/LA.  SCr 0.8, eCrCl >100, wt ~ 165 kg.  6/8 mrsa: neg 6/8 urine cx: neg 6/8 blood cx: GPC 6/8 resp cx: staph  6/8 LP: neg 6/8 HSV: neg  Goal of Therapy:  Vancomycin trough level 15-20 mcg/ml  Plan:  -Vancomycin 2500 mg IV x1 then 1500 mg IV q8h -Monitor renal fx, uop, cultures + sensitivities -Obtain VT at Css given age and body habitus    Agapito Games, PharmD, BCPS Clinical Pharmacist Pager: [phone] 12/26/2014 9:28 AM

## 2014-12-26 NOTE — Progress Notes (Signed)
PULMONARY / CRITICAL CARE MEDICINE   Name: Makayla Livingston MRN: [id number] DOB: January 10, 1996    ADMISSION DATE:  12/24/2014 CONSULTATION DATE:  12/24/14  REFERRING MD :  Dr. Littie Deeds   CHIEF COMPLAINT:  AMS/Seizures   INITIAL PRESENTATION: 19 y/o F with no known PMH who presented to [facility] ER on 6/8 with AMS.  Developed seizures in ER and subsequently intubated.    STUDIES:  6/08 Head CT >> no acute abnormality  6/8 EEG >  focal right frontal slowing and sharp activity but no evidence of subclinical seizure activity  SIGNIFICANT EVENTS: 6/07 Seen in ER for oral lesions, treated with carafate & d/c'd 6/08  Admitted to [facility] for AMS, developed seizures in ER and intubated.   6/9 MRI could not be done due to size   HISTORY OF PRESENT ILLNESS:  19 y/o F with no known PMH who on 6/7 presented to ER with complaints of sores on her tongue that began 6/6 with associated mouth pain.  She was treated with carafate and discharged.    At baseline, she lives with her mother and is a Consulting civil engineer at [employer]  She is studying biochemistry and psychology.  Mother denies known drug use and medical hx.  The patient has one brother and he is healthy.  No known bub bites / wounds.  Non-smoker.   Mother reports on 6/6 the patient didn't feel well at school and left early. She slept most of they day.  Then was seen in ER on 6/7.  The patient went home and was in her usual state of health.  The mother reports the am of 6/8 she woke to a loud noise and found her daughter on the floor with a bookshelf on top of her.  She was altered and unable to re-orient her.  EMS was activated and was witnessed to have 2 seizures.  In the ER, she had 4 more generalized full body seizures.  She was treated with IV ativan and haldol for agitation.  She continued to have seizures and decision was made to intubate for airway protection.  She had a total of 7 seizures between EMS and in the ER since presentation.  The patient had significant agitation  requiring 4 point restraints.  Placed on propofol and CT head evaluated which was negative.  Mother denies known fevers / chills, sick exposures.  PCCM called for ICU admission.    SUBJECTIVE: Low gr febrile Follows commands on wakeup assessment Good urine output  VITAL SIGNS: Temp:  [97.9 F (36.6 C)-100.3 F (37.9 C)] 99.1 F (37.3 C) (06/10 0732) Pulse Rate:  [68-131] 125 (06/10 0930) Resp:  [11-43] 29 (06/10 0930) BP: (94-136)/(51-85) 119/61 mmHg (06/10 0900) SpO2:  [97 %-100 %] 100 % (06/10 0930) FiO2 (%):  [40 %] 40 % (06/10 0849) Weight:  [365 lb 11.9 oz (165.9 kg)] 365 lb 11.9 oz (165.9 kg) (06/10 0500)   HEMODYNAMICS:     VENTILATOR SETTINGS: Vent Mode:  [-] Other (Comment) FiO2 (%):  [40 %] 40 % Set Rate:  [16 bmp] 16 bmp Vt Set:  [500 mL] 500 mL PEEP:  [5 cmH20] 5 cmH20 Pressure Support:  [10 cmH20-20 cmH20] 10 cmH20 Plateau Pressure:  [12 cmH20-26 cmH20] 12 cmH20   INTAKE / OUTPUT:  Intake/Output Summary (Last 24 hours) at 12/26/14 0936 Last data filed at 12/26/14 0900  Gross per 24 hour  Intake 4272.82 ml  Output   3280 ml  Net 992.82 ml    PHYSICAL EXAMINATION:  General:  Morbidly obese female in NAD  Neuro: follows commands, non focal, no nuchal rigidity  HEENT:  OETT, mm pink/moist Cardiovascular:  s1s2 rrr, no m/r/g Lungs:  resp's even/non-labored on vent, lungs bilaterally coarse Abdomen:  Obese/soft, bsx4 active  Musculoskeletal:  No acute deformities, 4pt restraints Skin:  Warm/dry, no overt edema  LABS:  CBC  Recent Labs Lab 12/24/14 0610 12/25/14 0253 12/26/14 0355  WBC 12.5* 7.9 7.8  HGB 11.9* 9.9* 10.0*  HCT 38.1 32.0* 31.9*  PLT 452* 315 322   Coag's No results for input(s): APTT, INR in the last 168 hours.   BMET  Recent Labs Lab 12/24/14 0610 12/25/14 0253 12/26/14 0355  NA 139 138 140  K 3.5 3.3* 3.3*  CL 102 111 108  CO2 19* 21* 24  BUN 13 <5* <5*  CREATININE 1.14* 0.73 0.80  GLUCOSE 116* 101* 106*    Electrolytes  Recent Labs Lab 12/24/14 0610 12/25/14 0253 12/26/14 0355  CALCIUM 9.1 8.1* 8.2*  MG  --  2.0  --   PHOS  --  3.4  --    Sepsis Markers No results for input(s): LATICACIDVEN, PROCALCITON, O2SATVEN in the last 168 hours.   ABG  Recent Labs Lab 12/24/14 1048 12/25/14 0417  PHART 7.291* 7.340*  PCO2ART 42.9 39.2  PO2ART 393.0* 183*     Liver Enzymes  Recent Labs Lab 12/24/14 0610 12/25/14 0253  AST 38 23  ALT 28 22  ALKPHOS 91 75  BILITOT 0.3 0.4  ALBUMIN 3.5 2.8*   Cardiac Enzymes No results for input(s): TROPONINI, PROBNP in the last 168 hours.   Glucose  Recent Labs Lab 12/25/14 0822 12/25/14 1112 12/25/14 1616 12/25/14 1944 12/25/14 2315 12/26/14 0731  GLUCAP 90 79 82 85 109* 102*    Imaging No results found.   ASSESSMENT / PLAN:  NEUROLOGIC A:   Acute Encephalopathy  -post ictal, resolving   Hx of oral lesions, seen in ER 6/7.   New onset Seizures-ruled out  Meningitis/ encephalitis, focal EEG  P:   RASS goal: 0 Propofol for sedation with seizures PRN fentanyl for pain  Keppra 500 mg IV BID  Seizure precautions, PRN ativan for seizure  Neuro following  MRI as outpt-  Cannot be done due to size, needs open MRI   INFECTIOUS A:   Doubt Meningitis or Encephalitis given clear CSF P:   BCx2 6/8 >> 1/2 GPC clusters >> Urine 6/8 >> ng  Sputum 6/8 >> staph aureus >> UDS 6/8 >> neg  CSF HSV pcr neg CSF cx neg   Start vanc while awaiting cx  PULMONARY OETT 6/8 >>  A: Acute Respiratory Failure - in setting of AMS/Seizures  P:   SBTs with goal extubation  CARDIOVASCULAR CVL A:  Sinus Tachycardia  Borderline QT interval - on admit 6/8, improved 6/9 P:  Tele monitoring   RENAL A:   Acute Kidney Injury - resolved AG Metabolic Acidosis - ? Lactic acidosis in setting of seizures , resolved Hypokalemia P:   NS @ 96ml/hr Trend BMP / UOP  Replace electrolytes as indicated   GASTROINTESTINAL A:   Morbid  Obesity P:   NPO  Advance post extubn  HEMATOLOGIC A:   Anemia  P:  Trend CBC Lovenox  for DVT prophylaxis   ENDOCRINE A:   Mild Hyperglycemia - suspect stress response related P:   Monitor BMP glucose    FAMILY  - Updates: Mother updated at bedside daily    Summary -  new onset seizures, clear CSF makes infection unlikely, given focal EEG-ct keppra, MRI as oupt Follow GPC in blood & sputum   The patient is critically ill with multiple organ systems failure and requires high complexity decision making for assessment and support, frequent evaluation and titration of therapies, application of advanced monitoring technologies and extensive interpretation of multiple databases. Critical Care Time devoted to patient care services described in this note independent of APP time is 35 minutes.   Cyril Mourning MD. Tonny Bollman. [facility] Pager [phone] If no response call [phone]   12/26/2014, 9:36 AM

## 2014-12-26 NOTE — Progress Notes (Signed)
Subjective: Patient remains intubated.  Sedation decreased.  No further seizure activity noted.    Objective: Current vital signs: BP 104/51 mmHg  Pulse 95  Temp(Src) 99.1 F (37.3 C) (Oral)  Resp 19  Ht  (1.702 m)  Wt 165.9 kg (365 lb 11.9 oz)  BMI 57.27 kg/m2  SpO2 99%  LMP 12/22/2014 Vital signs in last 24 hours: Temp:  [97.9 F (36.6 C)-100.3 F (37.9 C)] 99.1 F (37.3 C) (06/10 0732) Pulse Rate:  [68-117] 95 (06/10 0700) Resp:  [11-43] 19 (06/10 0700) BP: (94-136)/(51-85) 104/51 mmHg (06/10 0700) SpO2:  [97 %-100 %] 99 % (06/10 0700) FiO2 (%):  [40 %] 40 % (06/10 0600) Weight:  [165.9 kg (365 lb 11.9 oz)] 165.9 kg (365 lb 11.9 oz) (06/10 0500)  Intake/Output from previous day: 06/09 0701 - 06/10 0700 In: 4485.8 [I.V.:4015.8; NG/GT:260; IV Piggyback:210] Out: 3180 [Urine:2880; Emesis/NG output:300] Intake/Output this shift:   Nutritional status: Diet NPO time specified  Neurologic Exam: Mental Status: Intubated.  Eyes open.  Follows simple commands. Cranial Nerves: II: Blinks to bilateral confrontation, pupils right 4 mm, left 4 mm,and reactive bilaterally III,IV,VI: Extraocular movements intact bilaterally V,VII: corneal reflex present bilaterally  VIII: patient does not respond to verbal stimuli IX,X: gag reflex reduced, XI: trapezius strength unable to test bilaterally XII: tongue strength unable to test Motor: Moves all extremities spontaneously and to commmand Sensory: Responds to noxious stimuli in all extremities. Deep Tendon Reflexes:  Absent throughout. Plantars: mute bilaterally  Lab Results: Basic Metabolic Panel:  Recent Labs Lab 12/24/14 0610 12/25/14 0253 12/26/14 0355  NA 139 138 140  K 3.5 3.3* 3.3*  CL 102 111 108  CO2 19* 21* 24  GLUCOSE 116* 101* 106*  BUN 13 <5* <5*  CREATININE 1.14* 0.73 0.80  CALCIUM 9.1 8.1* 8.2*  MG  --  2.0  --   PHOS  --  3.4  --     Liver Function Tests:  Recent Labs Lab 12/24/14 0610  12/25/14 0253  AST 38 23  ALT 28 22  ALKPHOS 91 75  BILITOT 0.3 0.4  PROT 7.9 5.7*  ALBUMIN 3.5 2.8*   No results for input(s): LIPASE, AMYLASE in the last 168 hours. No results for input(s): AMMONIA in the last 168 hours.  CBC:  Recent Labs Lab 12/24/14 0610 12/25/14 0253 12/26/14 0355  WBC 12.5* 7.9 7.8  NEUTROABS 8.7*  --   --   HGB 11.9* 9.9* 10.0*  HCT 38.1 32.0* 31.9*  MCV 75.3* 74.1* 74.2*  PLT 452* 315 322    Cardiac Enzymes: No results for input(s): CKTOTAL, CKMB, CKMBINDEX, TROPONINI in the last 168 hours.  Lipid Panel:  Recent Labs Lab 12/25/14 0217  TRIG 74    CBG:  Recent Labs Lab 12/25/14 1112 12/25/14 1616 12/25/14 1944 12/25/14 2315 12/26/14 0731  GLUCAP 79 82 85 109* 102*    Microbiology: Results for orders placed or performed during the hospital encounter of 12/24/14  MRSA PCR Screening     Status: None   Collection Time: 12/24/14 11:28 AM  Result Value Ref Range Status   MRSA by PCR NEGATIVE NEGATIVE Final    Comment:        The GeneXpert MRSA Assay (FDA approved for NASAL specimens only), is one component of a comprehensive MRSA colonization surveillance program. It is not intended to diagnose MRSA infection nor to guide or monitor treatment for MRSA infections.   Urine culture     Status: None  Collection Time: 12/24/14 11:31 AM  Result Value Ref Range Status   Specimen Description URINE, CATHETERIZED  Final   Special Requests NONE  Final   Colony Count NO GROWTH Performed at [facility]   Final   Culture NO GROWTH Performed at [facility]   Final   Report Status 12/25/2014 FINAL  Final  Gram stain     Status: None   Collection Time: 12/24/14  3:27 PM  Result Value Ref Range Status   Specimen Description CSF  Final   Special Requests NONE  Final   Gram Stain   Final    WBC PRESENT, PREDOMINANTLY MONONUCLEAR NO ORGANISMS SEEN CYTOSPIN SLIDE    Report Status 12/24/2014 FINAL  Final  CSF  culture     Status: None (Preliminary result)   Collection Time: 12/24/14  3:27 PM  Result Value Ref Range Status   Specimen Description CSF  Final   Special Requests NONE  Final   Gram Stain   Final    WBC PRESENT, PREDOMINANTLY MONONUCLEAR NO ORGANISMS SEEN CYTOSPIN Performed at [facility] Performed at [facility]    Culture NO GROWTH Performed at [facility]   Final   Report Status PENDING  Incomplete  Culture, respiratory (tracheal aspirate)     Status: None (Preliminary result)   Collection Time: 12/24/14  4:17 PM  Result Value Ref Range Status   Specimen Description TRACHEAL ASPIRATE  Final   Special Requests NONE  Final   Gram Stain   Final    FEW WBC PRESENT,BOTH PMN AND MONONUCLEAR RARE SQUAMOUS EPITHELIAL CELLS PRESENT FEW GRAM POSITIVE COCCI IN PAIRS IN CHAINS IN CLUSTERS RARE GRAM NEGATIVE COCCI Performed at [facility]    Culture   Final    FEW STAPHYLOCOCCUS AUREUS Note: RIFAMPIN AND GENTAMICIN SHOULD NOT BE USED AS SINGLE DRUGS FOR TREATMENT OF STAPH INFECTIONS. Performed at [facility]    Report Status PENDING  Incomplete  Culture, blood (routine x 2)     Status: None (Preliminary result)   Collection Time: 12/25/14 12:10 AM  Result Value Ref Range Status   Specimen Description BLOOD LEFT NECK  Final   Special Requests BOTTLES DRAWN AEROBIC AND ANAEROBIC 10CC  Final   Culture   Final    GRAM POSITIVE COCCI IN CLUSTERS Note: Gram Stain Report Called to,Read Back By and Verified With: LAURA CAUDLE [id number]  BJENN Performed at [facility]    Report Status PENDING  Incomplete    Coagulation Studies: No results for input(s): LABPROT, INR in the last 72 hours.  Imaging: Ct Head Wo Contrast  12/24/2014   CLINICAL DATA:  Seizure. Initial encounter. Patient intubated following seizure.  EXAM: CT HEAD WITHOUT CONTRAST  TECHNIQUE: Contiguous axial images were obtained from the base of the skull through the  vertex without intravenous contrast.  COMPARISON:  None.  FINDINGS: Nonstandard scan plane was used because of obese body habitus and intubation. Endotracheal tube and probable orogastric tube present in the oropharynx. Debris is present in the posterior pharynx. Piercing is present over the RIGHT mandible.  No mass lesion, mass effect, midline shift, hydrocephalus, hemorrhage. No territorial ischemia or acute infarction. The calvarium is intact. Frontal sinuses and ethmoid air cells appear within normal limits. Over the anterior frontal lobes, there is slightly higher attenuation is most compatible with beam hardening artifact. This artifact is accentuated by the imaging plane.  IMPRESSION: 1. No acute intracranial abnormality. 2. Nonstandard imaging plane secondary  to body habitus.   Electronically Signed   By: Andreas Newport M.D.   On: 12/24/2014 09:10   Dg Chest Port 1 View  12/25/2014   CLINICAL DATA:  Central line placement  EXAM: PORTABLE CHEST - 1 VIEW  COMPARISON:  12/24/2014 at 15:39  FINDINGS: The endotracheal tube tip is 4.1 cm above the carina. There is a new left jugular central line extending into the low SVC. Nasogastric tube extends into the stomach. There is no pneumothorax. There is no confluent airspace consolidation. There is no large effusion.  IMPRESSION: New left jugular central line extends into the low SVC. No pneumothorax.   Electronically Signed   By: Ellery Plunk M.D.   On: 12/25/2014 02:10   Dg Chest Port 1 View  12/24/2014   CLINICAL DATA:  Acute respiratory failure. Seizures. Altered mental status.  EXAM: PORTABLE CHEST - 1 VIEW  COMPARISON:  12/24/2014 at 0755 hours.  FINDINGS: Support apparatus: Endotracheal tube tip 3.7 cm from the carina. The tip is at the level of the clavicles. Enteric tube is present with the tip near the cardia of the stomach.  Cardiomediastinal Silhouette:  Unchanged allowing for projection.  Lungs: Increasing opacification in the RIGHT upper lobe.  No pneumothorax.  Effusions:  None.  Other:  None.  IMPRESSION: 1. Retraction of endotracheal tube, now 3.7 cm from the carina. 2. Lower lung volumes with opacity in the RIGHT upper lobe, probably representing atelectasis although airspace disease could also have this appearance.   Electronically Signed   By: Andreas Newport M.D.   On: 12/24/2014 15:51   Dg Fluoro Guide Lumbar Puncture  12/24/2014   CLINICAL DATA:  19 year old female with seizure, recent diagnosis of Herpangina. Unsuccessful bedside lumbar puncture. Initial encounter.  EXAM: DIAGNOSTIC LUMBAR PUNCTURE UNDER FLUOROSCOPIC GUIDANCE  FLUOROSCOPY TIME:  Radiation Exposure Index (as provided by the fluoroscopic device):  If the device does not provide the exposure index:  Fluoroscopy Time (in minutes and seconds):  0 minutes 30 seconds  Number of Acquired Images:  2  PROCEDURE: Informed consent was obtained via the telephone from the patient's mother. A "time-out" was performed.  The patient was already intubated. She was placed prone, the lower back was prepped with Betadine.  1% Lidocaine was used for local anesthesia. Lumbar puncture was performed at the L2-L3 level using a 5 inch 20 knee gauge needle with return of clear, colorless CSF with an opening pressure of 15 cm water. 14 mL of CSF were obtained for laboratory studies.  The patient tolerated the procedure well and there were no apparent complications.  IMPRESSION: Fluoroscopic guided lumbar puncture. Clear CSF and normal opening pressure. 14 mL of CSF collected for lab analysis.   Electronically Signed   By: Odessa Fleming M.D.   On: 12/24/2014 15:52    Medications:  I have reviewed the patient's current medications. Scheduled: . antiseptic oral rinse  7 mL Mouth Rinse QID  . chlorhexidine  15 mL Mouth Rinse BID  . enoxaparin (LOVENOX) injection  40 mg Subcutaneous Q24H  . levETIRAcetam  500 mg Intravenous Q12H  . LORazepam  2 mg Intravenous Once  . pantoprazole (PROTONIX) IV  40 mg  Intravenous QHS  . potassium chloride  20 mEq Per Tube Q4H    Assessment/Plan: No further seizures noted.  Attempt to be made at extubation today.  Remains on Keppra at  BID.  Unable to have MRI due to size  Recommendations: 1.  MRI of the brain to  be performed on an outpatient basis.  Will likely need an open MRI for further work up.   2.  Continue Keppra 500mg  BID until able to take po. 3.  Continue seizure precautions   LOS: 2 days   Thana Farr, MD [facility] [phone] 12/26/2014  8:36 AM

## 2014-12-26 NOTE — Progress Notes (Signed)
eLink Physician-Brief Progress Note Patient Name: Makayla Livingston DOB: 1995/10/08 MRN: [id number]   Date of Service  12/26/2014  HPI/Events of Note  D10 @ 50 cc/hr for hypoglycemia but also on NS at 75 cc/hr  eICU Interventions  Plan: Continue D10 KVO NS     Intervention Category Minor Interventions: Routine modifications to care plan (e.g. PRN medications for pain, fever)  Glenn Christo 12/26/2014, 11:14 PM

## 2014-12-26 NOTE — Progress Notes (Signed)
eLink Physician-Brief Progress Note Patient Name: ARLESHA SPELMAN DOB: [birth year] MRN: [id number]   Date of Service  12/26/2014  HPI/Events of Note  GPCs in anaerobic bottle  eICU Interventions  GPCs in anaerobic bottle from blood.  WBC normal and afebrile.  Will not start abx.  CSF WBC is 2.     Intervention Category Major Interventions: Infection - evaluation and management  Harry Bark 12/26/2014, 4:11 AM

## 2014-12-26 NOTE — Progress Notes (Signed)
CRITICAL VALUE ALERT  Critical value received:  Gram positive cocci in clusters, anaerobic bottle, gram staining  Date of notification:  12/26/14  Time of notification:  4:05  Critical value read back:Yes.    Nurse who received alert:  Kris Hartmann, RN  MD notified (1st page):  E-link  Time of first page:  4:05  MD notified (2nd page):  Time of second page:  Responding MD:  Mare Ferrari  Time MD responded:  4:05

## 2014-12-27 ENCOUNTER — Inpatient Hospital Stay (HOSPITAL_COMMUNITY): Payer: Medicaid Other

## 2014-12-27 DIAGNOSIS — J9602 Acute respiratory failure with hypercapnia: Secondary | ICD-10-CM

## 2014-12-27 LAB — CBC
HCT: 30.9 % — ABNORMAL LOW (ref 36.0–46.0)
Hemoglobin: 9.8 g/dL — ABNORMAL LOW (ref 12.0–15.0)
MCH: 23.7 pg — ABNORMAL LOW (ref 26.0–34.0)
MCHC: 31.7 g/dL (ref 30.0–36.0)
MCV: 74.8 fL — AB (ref 78.0–100.0)
Platelets: 332 10*3/uL (ref 150–400)
RBC: 4.13 MIL/uL (ref 3.87–5.11)
RDW: 15.2 % (ref 11.5–15.5)
WBC: 8.5 10*3/uL (ref 4.0–10.5)

## 2014-12-27 LAB — CULTURE, RESPIRATORY W GRAM STAIN

## 2014-12-27 LAB — BASIC METABOLIC PANEL
ANION GAP: 7 (ref 5–15)
BUN: <5 mg/dL — ABNORMAL LOW (ref 6–20)
CHLORIDE: 111 mmol/L (ref 101–111)
CO2: 24 mmol/L (ref 22–32)
Calcium: 8.2 mg/dL — ABNORMAL LOW (ref 8.9–10.3)
Creatinine, Ser: 0.73 mg/dL (ref 0.44–1.00)
GFR calc non Af Amer: >60 mL/min (ref >60)
Glucose, Bld: 117 mg/dL — ABNORMAL HIGH (ref 65–99)
Potassium: 3.2 mmol/L — ABNORMAL LOW (ref 3.5–5.1)
SODIUM: 142 mmol/L (ref 135–145)

## 2014-12-27 LAB — GLUCOSE, CAPILLARY
GLUCOSE-CAPILLARY: 92 mg/dL (ref 65–99)
GLUCOSE-CAPILLARY: 86 mg/dL (ref 65–99)
Glucose-Capillary: 117 mg/dL — ABNORMAL HIGH (ref 65–99)
Glucose-Capillary: 71 mg/dL (ref 65–99)
Glucose-Capillary: 75 mg/dL (ref 65–99)
Glucose-Capillary: 84 mg/dL (ref 65–99)
Glucose-Capillary: 99 mg/dL (ref 65–99)

## 2014-12-27 LAB — CULTURE, BLOOD (ROUTINE X 2)

## 2014-12-27 LAB — CULTURE, RESPIRATORY

## 2014-12-27 MED ORDER — POTASSIUM CHLORIDE 10 MEQ/50ML IV SOLN
10.0000 meq | INTRAVENOUS | Status: AC
  Administered 2014-12-27 (×4): 10 meq via INTRAVENOUS
  Filled 2014-12-27 (×4): qty 50

## 2014-12-27 MED ORDER — ALBUTEROL SULFATE (2.5 MG/3ML) 0.083% IN NEBU
2.5000 mg | INHALATION_SOLUTION | RESPIRATORY_TRACT | Status: DC | PRN
  Administered 2014-12-27 – 2014-12-29 (×3): 2.5 mg via RESPIRATORY_TRACT
  Filled 2014-12-27 (×3): qty 3

## 2014-12-27 MED ORDER — LEVETIRACETAM 500 MG PO TABS
500.0000 mg | ORAL_TABLET | Freq: Two times a day (BID) | ORAL | Status: DC
  Administered 2014-12-27 – 2014-12-28 (×2): 500 mg via ORAL
  Filled 2014-12-27 (×5): qty 1

## 2014-12-27 NOTE — Progress Notes (Signed)
[facility] Electrolyte Replacement Protocol  Patient Name: ZAILYN THOENNES DOB: [birth year] MRN: [id number]  Date of Service  12/27/2014   HPI/Events of Note    Recent Labs Lab 12/24/14 0610 12/25/14 0253 12/26/14 0355 12/27/14 0410  NA 139 138 140 142  K 3.5 3.3* 3.3* 3.2*  CL 102 111 108 111  CO2 19* 21* 24 24  GLUCOSE 116* 101* 106* 117*  BUN 13 <5* <5* <5*  CREATININE 1.14* 0.73 0.80 0.73  CALCIUM 9.1 8.1* 8.2* 8.2*  MG  --  2.0  --   --   PHOS  --  3.4  --   --     Estimated Creatinine Clearance: 195.5 mL/min (by C-G formula based on Cr of 0.73).  Intake/Output      06/10 0701 - 06/11 0700   I.V. (mL/kg) 2370.8 (13.1)   NG/GT 30   IV Piggyback 1605   Total Intake(mL/kg) 4005.8 (22.1)   Urine (mL/kg/hr) 2600 (0.6)   Emesis/NG output 150 (0)   Total Output 2750   Net +1255.8        - I/O DETAILED x24h    Total I/O In: 1407.4 [I.V.:907.4; IV Piggyback:500] Out: 1200 [Urine:1200] - I/O THIS SHIFT    ASSESSMENT   eICURN Interventions  ICU Electrolyte Replacement Protocol criteria met. Labs Replaced per protocol. MD notified   ASSESSMENT: MAJOR ELECTROLYTE    Lorene Dy 12/27/2014, 5:47 AM

## 2014-12-27 NOTE — Evaluation (Signed)
Physical Therapy Evaluation Patient Details Name: Makayla Livingston MRN: [id number] DOB: June 18, 1996 Today's Date: 12/27/2014   History of Present Illness  19 y/o F with no known PMH who presented to [facility] ER on 6/8 with AMS. Developed seizures in ER and subsequently intubated. PMH: morbidly obese. Head CT with no acute abnormality.  Clinical Impression  Pt extremely deconditioned with flat affect. Pt struggling to amb > 20 feet at the time. Pt's mother strongly desires for patient to return home with her however pt does have 3 flights of stairs to enter their apartment. Acute PT to con't to follow to progress ambulation tolerance and work on Psychologist, counselling.    Follow Up Recommendations Home health PT;Supervision/Assistance - 24 hour (may need snf is she can't do 3 flights of stairs)    Equipment Recommendations  Rolling walker with 5" wheels (bariatric)    Recommendations for Other Services       Precautions / Restrictions Precautions Precautions: Fall Restrictions Weight Bearing Restrictions: No      Mobility  Bed Mobility               General bed mobility comments: pt up in chair  Transfers Overall transfer level: Needs assistance Equipment used: 2 person hand held assist Transfers: Sit to/from Stand Sit to Stand: Min assist         General transfer comment: pt very unsteady on feet with ant/posterior sway  Ambulation/Gait Ambulation/Gait assistance: +2 safety/equipment;+2 physical assistance;Mod assist Ambulation Distance (Feet): 50 Feet (3 seated rest breaks) Assistive device: Rolling walker (2 wheeled) (bariatric) Gait Pattern/deviations: Step-through pattern;Decreased stride length;Wide base of support Gait velocity: slow   General Gait Details: pt very unsteady, with ant/post sway and lateral sway,  wide base of support, freq seated rest breaks  Stairs            Wheelchair Mobility    Modified Rankin (Stroke Patients Only)       Balance  Overall balance assessment: Needs assistance         Standing balance support: Bilateral upper extremity supported Standing balance-Leahy Scale: Poor Standing balance comment: stood x 1 min to done mesh underwear. pt with significant swaying requiring to holdont posts of bariatric chair                             Pertinent Vitals/Pain Pain Assessment: No/denies pain    Home Living Family/patient expects to be discharged to:: Private residence Living Arrangements: Parent Available Help at Discharge: Family;Available 24 hours/day Type of Home: Apartment Home Access: Stairs to enter (3rd floor apartment) Entrance Stairs-Rails: Can reach both Entrance Stairs-Number of Steps: 3 flights Home Layout: One level Home Equipment: None      Prior Function Level of Independence: Independent         Comments: pt was going to school, driving     Hand Dominance   Dominant Hand: Right    Extremity/Trunk Assessment   Upper Extremity Assessment: Generalized weakness (mother reports she couldnt use hand now she is able to text)           Lower Extremity Assessment: Generalized weakness      Cervical / Trunk Assessment: Normal  Communication   Communication: No difficulties (soft spoken, slow to respond)  Cognition Arousal/Alertness: Lethargic Behavior During Therapy: Flat affect Overall Cognitive Status: Impaired/Different from baseline Area of Impairment: Attention;Following commands;Problem solving   Current Attention Level: Sustained   Following Commands: Follows one  step commands with increased time     Problem Solving: Slow processing General Comments: v/c's to maintain eye opening    General Comments      Exercises        Assessment/Plan    PT Assessment Patient needs continued PT services  PT Diagnosis Difficulty walking;Generalized weakness   PT Problem List Decreased strength;Decreased activity tolerance;Decreased balance;Decreased  mobility;Obesity  PT Treatment Interventions DME instruction;Gait training;Stair training;Functional mobility training;Therapeutic activities;Therapeutic exercise   PT Goals (Current goals can be found in the Care Plan section) Acute Rehab PT Goals Patient Stated Goal: home PT Goal Formulation: With patient/family Time For Goal Achievement: 01/03/15 Potential to Achieve Goals: Good    Frequency Min 3X/week   Barriers to discharge Inaccessible home environment 3 flights of stairs    Co-evaluation               End of Session   Activity Tolerance: Patient limited by fatigue Patient left: in chair;with call bell/phone within reach;with family/visitor present Nurse Communication: Mobility status         Time: 1155-1229 PT Time Calculation (min) (ACUTE ONLY): 34 min   Charges:   PT Evaluation $Initial PT Evaluation Tier I: 1 Procedure PT Treatments $Gait Training: 8-22 mins   PT G CodesMarcene Brawn 12/27/2014, 2:04 PM   Lewis Shock, PT, DPT Pager #: [phone] Office #: [phone]

## 2014-12-27 NOTE — Progress Notes (Signed)
Notified MD Deterding in regards to potassium of 3.2. Will continue to monitor and assess.

## 2014-12-27 NOTE — Progress Notes (Signed)
PULMONARY / CRITICAL CARE MEDICINE   Name: Makayla Livingston MRN: [id number] DOB: 1996-05-16    ADMISSION DATE:  12/24/2014 CONSULTATION DATE:  12/24/14  REFERRING MD :  Dr. Littie Deeds   CHIEF COMPLAINT:  AMS/Seizures   INITIAL PRESENTATION: 19 y/o F with no known PMH who presented to [facility] ER on 6/8 with AMS.  Developed seizures in ER and subsequently intubated.    STUDIES:  6/08 Head CT >> no acute abnormality  6/8 EEG >  focal right frontal slowing and sharp activity but no evidence of subclinical seizure activity  SIGNIFICANT EVENTS: 6/07 Seen in ER for oral lesions, treated with carafate & d/c'd 6/08  Admitted to [facility] for AMS, developed seizures in ER and intubated.   6/9 MRI could not be done due to size   HISTORY OF PRESENT ILLNESS:  19 y/o F with no known PMH who on 6/7 presented to ER with complaints of sores on her tongue that began 6/6 with associated mouth pain.  She was treated with carafate and discharged.    At baseline, she lives with her mother and is a Consulting civil engineer at [employer]  She is studying biochemistry and psychology.  Mother denies known drug use and medical hx.  The patient has one brother and he is healthy.  No known bub bites / wounds.  Non-smoker.   Mother reports on 6/6 the patient didn't feel well at school and left early. She slept most of they day.  Then was seen in ER on 6/7.  The patient went home and was in her usual state of health.  The mother reports the am of 6/8 she woke to a loud noise and found her daughter on the floor with a bookshelf on top of her.  She was altered and unable to re-orient her.  EMS was activated and was witnessed to have 2 seizures.  In the ER, she had 4 more generalized full body seizures.  She was treated with IV ativan and haldol for agitation.  She continued to have seizures and decision was made to intubate for airway protection.  She had a total of 7 seizures between EMS and in the ER since presentation.  The patient had significant agitation  requiring 4 point restraints.  Placed on propofol and CT head evaluated which was negative.  Mother denies known fevers / chills, sick exposures.  PCCM called for ICU admission.    SUBJECTIVE: Low gr febrile Follows commands on wakeup assessment Good urine output  VITAL SIGNS: Temp:  [98.9 F (37.2 C)-101.3 F (38.5 C)] 99 F (37.2 C) (06/11 0801) Pulse Rate:  [100-132] 114 (06/11 0800) Resp:  [20-40] 23 (06/11 0800) BP: (90-140)/(31-111) 114/71 mmHg (06/11 0800) SpO2:  [92 %-100 %] 96 % (06/11 0800) FiO2 (%):  [40 %] 40 % (06/10 0849) Weight:  [400 lb (181.439 kg)] 400 lb (181.439 kg) (06/11 0425)   HEMODYNAMICS:     VENTILATOR SETTINGS: Vent Mode:  [-] Other (Comment) FiO2 (%):  [40 %] 40 % PEEP:  [5 cmH20] 5 cmH20 Pressure Support:  [10 cmH20-20 cmH20] 10 cmH20 Plateau Pressure:  [12 cmH20] 12 cmH20   INTAKE / OUTPUT:  Intake/Output Summary (Last 24 hours) at 12/27/14 0846 Last data filed at 12/27/14 0801  Gross per 24 hour  Intake 4375.59 ml  Output   2775 ml  Net 1600.59 ml    PHYSICAL EXAMINATION: General:  Morbidly obese female in NAD  Neuro: follows commands, non focal, no nuchal rigidity  HEENT:  OETT, mm pink/moist Cardiovascular:  s1s2 rrr, no m/r/g Lungs:  resp's even/non-labored on vent, lungs bilaterally coarse Abdomen:  Obese/soft, bsx4 active  Musculoskeletal:  No acute deformities, 4pt restraints Skin:  Warm/dry, no overt edema  LABS:  CBC  Recent Labs Lab 12/25/14 0253 12/26/14 0355 12/27/14 0410  WBC 7.9 7.8 8.5  HGB 9.9* 10.0* 9.8*  HCT 32.0* 31.9* 30.9*  PLT 315 322 332   Coag's No results for input(s): APTT, INR in the last 168 hours.   BMET  Recent Labs Lab 12/25/14 0253 12/26/14 0355 12/27/14 0410  NA 138 140 142  K 3.3* 3.3* 3.2*  CL 111 108 111  CO2 21* 24 24  BUN <5* <5* <5*  CREATININE 0.73 0.80 0.73  GLUCOSE 101* 106* 117*   Electrolytes  Recent Labs Lab 12/25/14 0253 12/26/14 0355 12/27/14 0410   CALCIUM 8.1* 8.2* 8.2*  MG 2.0  --   --   PHOS 3.4  --   --    Sepsis Markers No results for input(s): LATICACIDVEN, PROCALCITON, O2SATVEN in the last 168 hours.   ABG  Recent Labs Lab 12/24/14 1048 12/25/14 0417  PHART 7.291* 7.340*  PCO2ART 42.9 39.2  PO2ART 393.0* 183*     Liver Enzymes  Recent Labs Lab 12/24/14 0610 12/25/14 0253  AST 38 23  ALT 28 22  ALKPHOS 91 75  BILITOT 0.3 0.4  ALBUMIN 3.5 2.8*   Cardiac Enzymes No results for input(s): TROPONINI, PROBNP in the last 168 hours.   Glucose  Recent Labs Lab 12/26/14 1600 12/26/14 1955 12/26/14 2338 12/27/14 0256 12/27/14 0408 12/27/14 0754  GLUCAP 93 89 84 71 117* 92    Imaging Dg Chest Port 1 View  12/27/2014   CLINICAL DATA:  Respiratory failure  EXAM: PORTABLE CHEST - 1 VIEW  COMPARISON:  12/25/2014  FINDINGS: The endotracheal tube and nasogastric tube have been removed. The left jugular central line is withdrawn from its former position. It now does not reach the SVC but it still is within the central circulation, probably reaching the brachiocephalic vein. Mild left base opacity persists. There is no pneumothorax.  IMPRESSION: Left jugular central line is withdrawn from its former position, no longer extending into the SVC but likely reaching the left brachiocephalic vein.  Left base atelectasis or infiltrate.   Electronically Signed   By: Ellery Plunk M.D.   On: 12/27/2014 06:47     ASSESSMENT / PLAN:  NEUROLOGIC A:   Acute Encephalopathy  -post ictal, resolving   Hx of oral lesions, seen in ER 6/7.   New onset Seizures-ruled out  Meningitis/ encephalitis, focal EEG  P:   RASS goal: 1 Propofol for sedation with seizures PRN fentanyl for pain  Keppra 500 mg IV BID  Seizure precautions, PRN ativan for seizure  Neuro following  MRI as outpt-  Cannot be done due to size, needs open MRI  INFECTIOUS A:   Doubt Meningitis or Encephalitis given clear CSF P:   BCx2 6/8 >> 1/2 GPC  clusters >> Urine 6/8 >> ng  Sputum 6/8 >> staph aureus >> UDS 6/8 >> neg  CSF HSV pcr neg CSF cx neg   Continue vanc 6/09>> While awaiting cx  PULMONARY OETT 6/8 >> 6/10 A: Acute Respiratory Failure - in setting of AMS/Seizures  P:   Needs nocturnal cpap for undiagnosed OSA  CARDIOVASCULAR CVL A:  Sinus Tachycardia  Borderline QT interval - on admit 6/8, improved 6/11 P:  Tele monitoring   RENAL  Lab Results  Component Value Date   CREATININE 0.73 12/27/2014   CREATININE 0.80 12/26/2014   CREATININE 0.73 12/25/2014    A:   Acute Kidney Injury - resolved AG Metabolic Acidosis - ? Lactic acidosis in setting of seizures , resolved Hypokalemia P:   Trend BMP / UOP  Replace electrolytes as indicated   GASTROINTESTINAL A:   Morbid Obesity P:   Advance diet  HEMATOLOGIC A:   Anemia  P:  Trend CBC Lovenox  for DVT prophylaxis   ENDOCRINE CBG (last 3)   Recent Labs  12/27/14 0256 12/27/14 0408 12/27/14 0754  GLUCAP 71 117* 92   A:   Mild Hyperglycemia - suspect stress response related P:   Monitor BMP glucose   FAMILY  - Updates: Mother updated at bedside daily    Summary - new onset seizures, clear CSF makes infection unlikely, given focal EEG-ct keppra, MRI as oupt Follow GPC in blood & sputum No further szs, on PO keppra. De intensify care, tx to sdu and to [facility].  Brett Canales Minor ACNP [facility] PCCM Pager [phone] till 3 pm If no answer page [phone] 12/27/2014, 8:51 AM  Still concerned for respiratory status, patient is super morbidly obese and her respiratory status is questionable at best.  Will hold in the ICU for monitoring.  The patient is critically ill with multiple organ systems failure and requires high complexity decision making for assessment and support, frequent evaluation and titration of therapies, application of advanced monitoring technologies and extensive interpretation of multiple databases.   Critical Care Time  devoted to patient care services described in this note is  35  Minutes. This time reflects time of care of this signee Dr Koren Bound. This critical care time does not reflect procedure time, or teaching time or supervisory time of PA/NP/Med student/Med Resident etc but could involve care discussion time.  Alyson Reedy, M.D. [facility] Pulmonary/Critical Care Medicine. Pager: [phone]. After hours pager: [phone].

## 2014-12-27 NOTE — Progress Notes (Signed)
Subjective: Patient awake and alert.  Extubated.  No further seizures noted.    Objective: Current vital signs: BP 109/67 mmHg  Pulse 109  Temp(Src) 99.5 F (37.5 C) (Oral)  Resp 26  Ht 5\' 7"  (1.702 m)  Wt 181.439 kg (400 lb)  BMI 62.63 kg/m2  SpO2 92%  LMP 12/22/2014 Vital signs in last 24 hours: Temp:  [98.9 F (37.2 C)-101.3 F (38.5 C)] 99.5 F (37.5 C) (06/11 0300) Pulse Rate:  [104-132] 109 (06/11 0600) Resp:  [17-40] 26 (06/11 0600) BP: (90-140)/(31-111) 109/67 mmHg (06/11 0600) SpO2:  [92 %-100 %] 92 % (06/11 0600) FiO2 (%):  [40 %] 40 % (06/10 0849) Weight:  [181.439 kg (400 lb)] 181.439 kg (400 lb) (06/11 0425)  Intake/Output from previous day: 06/10 0701 - 06/11 0700 In: 4175.8 [I.V.:2490.8; NG/GT:30; IV Piggyback:1655] Out: 2950 [Urine:2800; Emesis/NG output:150] Intake/Output this shift: Total I/O In: 50 [IV Piggyback:50] Out: -  Nutritional status: Diet clear liquid Room service appropriate?: Yes; Fluid consistency:: Thin  Neurologic Exam: Mental Status: Alert, extubated.  Speech fluent without evidence of aphasia.  Able to follow 3 step commands without difficulty. Cranial Nerves: II: Discs flat bilaterally; Visual fields grossly normal, pupils equal, round, reactive to light and accommodation III,IV, VI: ptosis not present, extra-ocular motions intact bilaterally V,VII: smile symmetric, facial light touch sensation normal bilaterally VIII: hearing normal bilaterally IX,X: gag reflex present XI: bilateral shoulder shrug XII: midline tongue extension Motor: Lifts all extremities off the bed with no focal weakness noted   Lab Results: Basic Metabolic Panel:  Recent Labs Lab 12/24/14 0610 12/25/14 0253 12/26/14 0355 12/27/14 0410  NA 139 138 140 142  K 3.5 3.3* 3.3* 3.2*  CL 102 111 108 111  CO2 19* 21* 24 24  GLUCOSE 116* 101* 106* 117*  BUN 13 <5* <5* <5*  CREATININE 1.14* 0.73 0.80 0.73  CALCIUM 9.1 8.1* 8.2* 8.2*  MG  --  2.0  --    --   PHOS  --  3.4  --   --     Liver Function Tests:  Recent Labs Lab 12/24/14 0610 12/25/14 0253  AST 38 23  ALT 28 22  ALKPHOS 91 75  BILITOT 0.3 0.4  PROT 7.9 5.7*  ALBUMIN 3.5 2.8*   No results for input(s): LIPASE, AMYLASE in the last 168 hours. No results for input(s): AMMONIA in the last 168 hours.  CBC:  Recent Labs Lab 12/24/14 0610 12/25/14 0253 12/26/14 0355 12/27/14 0410  WBC 12.5* 7.9 7.8 8.5  NEUTROABS 8.7*  --   --   --   HGB 11.9* 9.9* 10.0* 9.8*  HCT 38.1 32.0* 31.9* 30.9*  MCV 75.3* 74.1* 74.2* 74.8*  PLT 452* 315 322 332    Cardiac Enzymes: No results for input(s): CKTOTAL, CKMB, CKMBINDEX, TROPONINI in the last 168 hours.  Lipid Panel:  Recent Labs Lab 12/25/14 0217  TRIG 74    CBG:  Recent Labs Lab 12/26/14 1600 12/26/14 1955 12/26/14 2338 12/27/14 0256 12/27/14 0408  GLUCAP 93 89 84 71 117*    Microbiology: Results for orders placed or performed during the hospital encounter of 12/24/14  MRSA PCR Screening     Status: None   Collection Time: 12/24/14 11:28 AM  Result Value Ref Range Status   MRSA by PCR NEGATIVE NEGATIVE Final    Comment:        The GeneXpert MRSA Assay (FDA approved for NASAL specimens only), is one component of a comprehensive MRSA colonization surveillance  program. It is not intended to diagnose MRSA infection nor to guide or monitor treatment for MRSA infections.   Urine culture     Status: None   Collection Time: 12/24/14 11:31 AM  Result Value Ref Range Status   Specimen Description URINE, CATHETERIZED  Final   Special Requests NONE  Final   Colony Count NO GROWTH Performed at [facility]   Final   Culture NO GROWTH Performed at [facility]   Final   Report Status 12/25/2014 FINAL  Final  Gram stain     Status: None   Collection Time: 12/24/14  3:27 PM  Result Value Ref Range Status   Specimen Description CSF  Final   Special Requests NONE  Final   Gram Stain    Final    WBC PRESENT, PREDOMINANTLY MONONUCLEAR NO ORGANISMS SEEN CYTOSPIN SLIDE    Report Status 12/24/2014 FINAL  Final  CSF culture     Status: None (Preliminary result)   Collection Time: 12/24/14  3:27 PM  Result Value Ref Range Status   Specimen Description CSF  Final   Special Requests NONE  Final   Gram Stain   Final    WBC PRESENT, PREDOMINANTLY MONONUCLEAR NO ORGANISMS SEEN CYTOSPIN Performed at [facility] Performed at [facility]    Culture   Final    NO GROWTH 1 DAY Performed at [facility]    Report Status PENDING  Incomplete  Culture, respiratory (tracheal aspirate)     Status: None (Preliminary result)   Collection Time: 12/24/14  4:17 PM  Result Value Ref Range Status   Specimen Description TRACHEAL ASPIRATE  Final   Special Requests NONE  Final   Gram Stain   Final    FEW WBC PRESENT,BOTH PMN AND MONONUCLEAR RARE SQUAMOUS EPITHELIAL CELLS PRESENT FEW GRAM POSITIVE COCCI IN PAIRS IN CHAINS IN CLUSTERS RARE GRAM NEGATIVE COCCI Performed at [facility]    Culture   Final    FEW STAPHYLOCOCCUS AUREUS Note: RIFAMPIN AND GENTAMICIN SHOULD NOT BE USED AS SINGLE DRUGS FOR TREATMENT OF STAPH INFECTIONS. Performed at [facility]    Report Status PENDING  Incomplete  Culture, blood (routine x 2)     Status: None (Preliminary result)   Collection Time: 12/25/14 12:10 AM  Result Value Ref Range Status   Specimen Description BLOOD LEFT NECK  Final   Special Requests BOTTLES DRAWN AEROBIC AND ANAEROBIC 10CC  Final   Culture   Final    GRAM POSITIVE COCCI IN CLUSTERS Note: Gram Stain Report Called to,Read Back By and Verified With: LAURA CAUDLE [id number]  BJENN Performed at [facility]    Report Status PENDING  Incomplete    Coagulation Studies: No results for input(s): LABPROT, INR in the last 72 hours.  Imaging: Dg Chest Port 1 View  12/27/2014   CLINICAL DATA:  Respiratory failure  EXAM: PORTABLE  CHEST - 1 VIEW  COMPARISON:  12/25/2014  FINDINGS: The endotracheal tube and nasogastric tube have been removed. The left jugular central line is withdrawn from its former position. It now does not reach the SVC but it still is within the central circulation, probably reaching the brachiocephalic vein. Mild left base opacity persists. There is no pneumothorax.  IMPRESSION: Left jugular central line is withdrawn from its former position, no longer extending into the SVC but likely reaching the left brachiocephalic vein.  Left base atelectasis or infiltrate.   Electronically Signed   By: Bevelyn Buckles  Clovis Riley M.D.   On: 12/27/2014 06:47    Medications:  I have reviewed the patient's current medications. Scheduled: . antiseptic oral rinse  7 mL Mouth Rinse q12n4p  . enoxaparin (LOVENOX) injection  80 mg Subcutaneous Q24H  . levETIRAcetam  500 mg Intravenous Q12H  . LORazepam  2 mg Intravenous Once  . pantoprazole (PROTONIX) IV  40 mg Intravenous QHS  . potassium chloride  10 mEq Intravenous Q1 Hr x 4  . vancomycin  1,500 mg Intravenous Q8H    Assessment/Plan: No further seizures on Keppra.  No side effects noted.  Etiology of seizures unclear.  Head CT unremarkable.  CSF unremarkable including HSV titers and cultures.  EEG with focal slowing but MRI unable to be performed as an inpatient.    Recommendations: 1.  Continue Keppra at current dose.  Will change to po today. 2.  MRI of the brain with and without contrast as an outpatient.   3.  Patient unable to drive, operate heavy machinery, perform activities at heights and participate in water activities until release by outpatient physician.   LOS: 3 days   Thana Farr, MD [facility] [phone] 12/27/2014  7:48 AM

## 2014-12-27 NOTE — Progress Notes (Signed)
eLink Physician-Brief Progress Note Patient Name: Makayla Livingston DOB: May 25, 1996 MRN: [id number]   Date of Service  12/27/2014  HPI/Events of Note  Patient alert but c/o of SOB and chest tightness.  Some wheezing noted on lung exam.  eICU Interventions  Plan: PRN albuterol nebs Continue to monitor patient     Intervention Category Intermediate Interventions: Respiratory distress - evaluation and management  Aarnav Steagall 12/27/2014, 3:21 AM

## 2014-12-28 LAB — GLUCOSE, CAPILLARY
GLUCOSE-CAPILLARY: 67 mg/dL (ref 65–99)
GLUCOSE-CAPILLARY: 90 mg/dL (ref 65–99)
Glucose-Capillary: 102 mg/dL — ABNORMAL HIGH (ref 65–99)
Glucose-Capillary: 76 mg/dL (ref 65–99)
Glucose-Capillary: 84 mg/dL (ref 65–99)
Glucose-Capillary: 98 mg/dL (ref 65–99)

## 2014-12-28 LAB — BASIC METABOLIC PANEL
Anion gap: 9 (ref 5–15)
BUN: <5 mg/dL — ABNORMAL LOW (ref 6–20)
CHLORIDE: 106 mmol/L (ref 101–111)
CO2: 26 mmol/L (ref 22–32)
Calcium: 8.5 mg/dL — ABNORMAL LOW (ref 8.9–10.3)
Creatinine, Ser: 0.7 mg/dL (ref 0.44–1.00)
GFR calc Af Amer: >60 mL/min (ref >60)
GFR calc non Af Amer: >60 mL/min (ref >60)
GLUCOSE: 97 mg/dL (ref 65–99)
Potassium: 3.3 mmol/L — ABNORMAL LOW (ref 3.5–5.1)
Sodium: 141 mmol/L (ref 135–145)

## 2014-12-28 LAB — CSF CULTURE W GRAM STAIN

## 2014-12-28 LAB — CBC
HCT: 31.3 % — ABNORMAL LOW (ref 36.0–46.0)
Hemoglobin: 10.1 g/dL — ABNORMAL LOW (ref 12.0–15.0)
MCH: 23.8 pg — AB (ref 26.0–34.0)
MCHC: 32.3 g/dL (ref 30.0–36.0)
MCV: 73.8 fL — ABNORMAL LOW (ref 78.0–100.0)
Platelets: 321 10*3/uL (ref 150–400)
RBC: 4.24 MIL/uL (ref 3.87–5.11)
RDW: 14.9 % (ref 11.5–15.5)
WBC: 6.5 10*3/uL (ref 4.0–10.5)

## 2014-12-28 LAB — VANCOMYCIN, TROUGH: Vancomycin Tr: 19 ug/mL (ref 10.0–20.0)

## 2014-12-28 LAB — CSF CULTURE: CULTURE: NO GROWTH

## 2014-12-28 MED ORDER — SODIUM CHLORIDE 0.9 % IJ SOLN: 10.0000 mL | INTRAMUSCULAR | Status: DC | PRN

## 2014-12-28 MED ORDER — ACETAMINOPHEN 325 MG PO TABS: 650.0000 mg | ORAL_TABLET | Freq: Four times a day (QID) | ORAL | Status: DC | PRN

## 2014-12-28 MED ORDER — TRAMADOL HCL 50 MG PO TABS
50.0000 mg | ORAL_TABLET | Freq: Four times a day (QID) | ORAL | Status: DC | PRN
Start: 2014-12-28 — End: 2014-12-29
  Administered 2014-12-29: 50 mg via ORAL
  Filled 2014-12-28: qty 1

## 2014-12-28 MED ORDER — LORAZEPAM 0.5 MG PO TABS
0.5000 mg | ORAL_TABLET | ORAL | Status: DC | PRN
  Administered 2014-12-28: 0.5 mg via ORAL
  Filled 2014-12-28 (×2): qty 1

## 2014-12-28 MED ORDER — MAGIC MOUTHWASH W/LIDOCAINE
5.0000 mL | Freq: Three times a day (TID) | ORAL | Status: DC | PRN
  Filled 2014-12-28: qty 5

## 2014-12-28 MED ORDER — LACOSAMIDE 50 MG PO TABS
100.0000 mg | ORAL_TABLET | Freq: Two times a day (BID) | ORAL | Status: DC
  Administered 2014-12-29: 100 mg via ORAL
  Filled 2014-12-28 (×2): qty 2

## 2014-12-28 MED ORDER — LORAZEPAM 2 MG/ML IJ SOLN: 2.0000 mg | Freq: Once | INTRAMUSCULAR | Status: AC | PRN

## 2014-12-28 MED ORDER — ACETAMINOPHEN-CODEINE #3 300-30 MG PO TABS: 1.0000 | ORAL_TABLET | Freq: Four times a day (QID) | ORAL | Status: DC | PRN

## 2014-12-28 MED ORDER — SODIUM CHLORIDE 0.9 % IV SOLN
200.0000 mg | Freq: Once | INTRAVENOUS | Status: AC
  Administered 2014-12-28: 200 mg via INTRAVENOUS
  Filled 2014-12-28: qty 20

## 2014-12-28 NOTE — Progress Notes (Signed)
Pt is transferred to 4 N04  From 2 M13. Admission vital signs are stable.

## 2014-12-28 NOTE — Progress Notes (Signed)
Patient ripped out IV and burst into the hall walking to the exit. Pt on her cell phone calling her mom to pick her up. Blood from the IV site running down her arm. Patient refusing PO Ativan and with no IV access. Patient saying, "this isn't normal." Pt grabbed RN's badge and flipped through it. Patient's gait unsteady and she is breathing heavily. RN spoke with patient's mother on the phone and she is coming back to the hospital to be with the patient. Pt sitting on the couch by the window with her friend. Friend is trying to talk to the patient. MD paged with no new orders. Will continue to monitor closely. Beth Goodlin, [place] Scrape, RN

## 2014-12-28 NOTE — Progress Notes (Addendum)
[facility] TEAM 1 - Stepdown/ICU TEAM Progress Note  Makayla Livingston KVQ:[id number] DOB: [birth year] DOA: 12/24/2014 PCP: Lyda Perone, MD   Admit HPI / Brief Narrative: 19 y/o F with no known PMH who on 6/7 presented to ER with complaints of sores on her tongue that began 6/6 with associated mouth pain. She was treated with carafate and discharged.  At baseline, she lives with her mother and is a Consulting civil engineer at [employer] She is studying biochemistry and psychology. Mother denies known drug use. The patient has one brother who is healthy. No known bug bites or wounds. Non-smoker.  After she was was seen in ER on 6/7 she went home and was in her usual state of health. The mother reports the am of 6/8 she woke to a loud noise and found her daughter on the floor with a bookshelf on top of her. She was altered and unable to re-orient her. EMS was activated and was witnessed to have 2 seizures.   In the ER she had 4 more generalized full body seizures. She was treated with IV ativan and haldol for agitation. She continued to have seizures and decision was made to intubate for airway protection. She had a total of 7 seizures between EMS and in the ER. The patient had significant agitation requiring 4 point restraints. Placed on propofol and CT head was negative.   Significant Events: 6/07 Seen in ER for oral lesions, treated with carafate & d/c'd 6/08Admitted to [facility] for AMS, developed seizures in ER and intubated.  6/9 MRI could not be done due to size  HPI/Subjective: The patient is resting comfortably in bed at the time of my exam.  She denies current shortness of breath chest pain nausea vomiting fevers or chills.  She does report a very low-grade generalized headache but does not seem to be particularly bothered by this.  Assessment/Plan:  New onset Seizures MRI as outpt (cannot be done as inpt due to size - needs open MRI) - Neurology prescribing meds - appears stable at this time    Acute Encephalopathy CSF not c/w meningitis - suspect post-ictal state - is slowly clearing, though mother reports intermittent episodes of atypical behavior/confusion - check B12/folate levels   Hx of idiopathic oral lesions seen in ER 6/7 - physical exam presently without obvious findings - patient continues to report pain in her mouth - symptomatic control and follow  Coag-negative staph bacteremia - Staph aureus in trach aspirate Pt had 1/1 blood cx positive - this may well be a contaminant, but it is difficult to tell w/ only one cx having been done - for now will cont vanc   Hypokalemia Replace and follow   Sinus Tachycardia  Resolved   Acute Kidney Injury Resolved  Morbid Obesity  Code Status: FULL Family Communication: spoke w/ mother at beside at length  Disposition Plan: Stable for transfer to neurology medical bed - PT/OT - follow mental status   Consultants: PCCM Neurology  Antibiotics: Vanc 6/10 >  DVT prophylaxis: lovenox   Objective: Blood pressure 107/64, pulse 93, temperature 97.2 F (36.2 C), temperature source Oral, resp. rate 24, height  (1.702 m), weight 181.439 kg (400 lb), last menstrual period 12/22/2014, SpO2 98 %.  Intake/Output Summary (Last 24 hours) at 12/28/14 0904 Last data filed at 12/28/14 0800  Gross per 24 hour  Intake   2650 ml  Output      0 ml  Net   2650 ml   Exam: General:  No acute respiratory distress Lungs: Clear to auscultation bilaterally without wheezes or crackles - breath sounds distant due to body habitus Cardiovascular: Regular rate and rhythm without murmur gallop or rub normal S1 and S2 Abdomen: Nontender, morbidly obese, soft, bowel sounds positive, no rebound, no ascites, no appreciable mass Extremities: No significant cyanosis, clubbing, or edema bilateral lower extremities  Data Reviewed: Basic Metabolic Panel:  Recent Labs Lab 12/24/14 0610 12/25/14 0253 12/26/14 0355 12/27/14 0410  12/28/14 0415  NA 139 138 140 142 141  K 3.5 3.3* 3.3* 3.2* 3.3*  CL 102 111 108 111 106  CO2 19* 21* GLUCOSE 116* 101* 106* 117* 97  BUN 13 <5* <5* <5* <5*  CREATININE 1.14* 0.73 0.80 0.73 0.70  CALCIUM 9.1 8.1* 8.2* 8.2* 8.5*  MG  --  2.0  --   --   --   PHOS  --  3.4  --   --   --     CBC:  Recent Labs Lab 12/24/14 0610 12/25/14 0253 12/26/14 0355 12/27/14 0410 12/28/14 0415  WBC 12.5* 7.9 7.8 8.5 6.5  NEUTROABS 8.7*  --   --   --   --   HGB 11.9* 9.9* 10.0* 9.8* 10.1*  HCT 38.1 32.0* 31.9* 30.9* 31.3*  MCV 75.3* 74.1* 74.2* 74.8* 73.8*  PLT 452* 315 322 332 321    Liver Function Tests:  Recent Labs Lab 12/24/14 0610 12/25/14 0253  AST 38 23  ALT 28 22  ALKPHOS 91 75  BILITOT 0.3 0.4  PROT 7.9 5.7*  ALBUMIN 3.5 2.8*   CBG:  Recent Labs Lab 12/27/14 1953 12/27/14 2339 12/28/14 0346 12/28/14 0426 12/28/14 0823  GLUCAP 75 98 67 90 102*    Recent Results (from the past 240 hour(s))  MRSA PCR Screening     Status: None   Collection Time: 12/24/14 11:28 AM  Result Value Ref Range Status   MRSA by PCR NEGATIVE NEGATIVE Final    Comment:        The GeneXpert MRSA Assay (FDA approved for NASAL specimens only), is one component of a comprehensive MRSA colonization surveillance program. It is not intended to diagnose MRSA infection nor to guide or monitor treatment for MRSA infections.   Urine culture     Status: None   Collection Time: 12/24/14 11:31 AM  Result Value Ref Range Status   Specimen Description URINE, CATHETERIZED  Final   Special Requests NONE  Final   Colony Count NO GROWTH Performed at [facility]   Final   Culture NO GROWTH Performed at [facility]   Final   Report Status 12/25/2014 FINAL  Final  Gram stain     Status: None   Collection Time: 12/24/14  3:27 PM  Result Value Ref Range Status   Specimen Description CSF  Final   Special Requests NONE  Final   Gram Stain   Final    WBC PRESENT,  PREDOMINANTLY MONONUCLEAR NO ORGANISMS SEEN CYTOSPIN SLIDE    Report Status 12/24/2014 FINAL  Final  CSF culture     Status: None (Preliminary result)   Collection Time: 12/24/14  3:27 PM  Result Value Ref Range Status   Specimen Description CSF  Final   Special Requests NONE  Final   Gram Stain   Final    WBC PRESENT, PREDOMINANTLY MONONUCLEAR NO ORGANISMS SEEN CYTOSPIN Performed at [facility] Performed at [facility]    Culture   Final  NO GROWTH 2 DAYS Performed at [facility]    Report Status PENDING  Incomplete  Culture, respiratory (tracheal aspirate)     Status: None   Collection Time: 12/24/14  4:17 PM  Result Value Ref Range Status   Specimen Description TRACHEAL ASPIRATE  Final   Special Requests NONE  Final   Gram Stain   Final    FEW WBC PRESENT,BOTH PMN AND MONONUCLEAR RARE SQUAMOUS EPITHELIAL CELLS PRESENT FEW GRAM POSITIVE COCCI IN PAIRS IN CHAINS IN CLUSTERS RARE GRAM NEGATIVE COCCI Performed at [facility]    Culture   Final    FEW STAPHYLOCOCCUS AUREUS Note: RIFAMPIN AND GENTAMICIN SHOULD NOT BE USED AS SINGLE DRUGS FOR TREATMENT OF STAPH INFECTIONS. Performed at [facility]    Report Status 12/27/2014 FINAL  Final   Organism ID, Bacteria STAPHYLOCOCCUS AUREUS  Final      Susceptibility   Staphylococcus aureus - MIC*    CLINDAMYCIN <=0.25 SENSITIVE Sensitive     ERYTHROMYCIN <=0.25 SENSITIVE Sensitive     GENTAMICIN <=0.5 SENSITIVE Sensitive     LEVOFLOXACIN <=0.12 SENSITIVE Sensitive     OXACILLIN 0.5 SENSITIVE Sensitive     PENICILLIN 0.06 SENSITIVE Sensitive     RIFAMPIN <=0.5 SENSITIVE Sensitive     TRIMETH/SULFA <=10 SENSITIVE Sensitive     VANCOMYCIN 1 SENSITIVE Sensitive     TETRACYCLINE <=1 SENSITIVE Sensitive     MOXIFLOXACIN <=0.25 SENSITIVE Sensitive     * FEW STAPHYLOCOCCUS AUREUS  Culture, blood (routine x 2)     Status: None   Collection Time: 12/25/14 12:10 AM  Result Value Ref  Range Status   Specimen Description BLOOD LEFT NECK  Final   Special Requests BOTTLES DRAWN AEROBIC AND ANAEROBIC 10CC  Final   Culture   Final    STAPHYLOCOCCUS SPECIES (COAGULASE NEGATIVE) Note: THE SIGNIFICANCE OF ISOLATING THIS ORGANISM FROM A SINGLE VENIPUNCTURE CANNOT BE PREDICTED WITHOUT FURTHER CLINICAL AND CULTURE CORRELATION. SUSCEPTIBILITIES AVAILABLE ONLY ON REQUEST. Note: Gram Stain Report Called to,Read Back By and Verified With: LAURA CAUDLE [id number]@405AM  BJENN Performed at [facility]    Report Status 12/27/2014 FINAL  Final    Studies:   Recent x-ray studies have been reviewed in detail by the Attending Physician  Scheduled Meds:  Scheduled Meds: . enoxaparin (LOVENOX) injection  80 mg Subcutaneous Q24H  . levETIRAcetam  500 mg Oral BID  . LORazepam  2 mg Intravenous Once  . vancomycin  1,500 mg Intravenous Q8H    Time spent on care of this patient: 35 mins   Ramesh Moan T , MD   [facility] Office  [phone] Pager - Text Page per Loretha Stapler as per below:  On-Call/Text Page:      [email or web address]      password [email or web address]  If 7PM-7AM, please contact night-coverage [email or web address] Password [email or web address] 12/28/2014, 9:04 AM   LOS: 4 days

## 2014-12-28 NOTE — Progress Notes (Signed)
Subjective: Patient without further seizure activity.  On Keppra but reports dizziness when up.    Objective: Current vital signs: BP 107/64 mmHg  Pulse 93  Temp(Src) 97.2 F (36.2 C) (Oral)  Resp 24  Ht  (1.702 m)  Wt 181.439 kg (400 lb)  BMI 62.63 kg/m2  SpO2 98%  LMP 12/22/2014 Vital signs in last 24 hours: Temp:  [97.2 F (36.2 C)-98.5 F (36.9 C)] 97.2 F (36.2 C) (06/12 0821) Pulse Rate:  [79-108] 93 (06/12 0700) Resp:  [12-32] 24 (06/12 0700) BP: (96-143)/(51-90) 107/64 mmHg (06/12 0700) SpO2:  [95 %-100 %] 98 % (06/12 0700)  Intake/Output from previous day: 06/11 0701 - 06/12 0700 In: 2865 [I.V.:1160; IV Piggyback:1705] Out: 225 [Urine:225] Intake/Output this shift: Total I/O In: 50 [I.V.:50] Out: -  Nutritional status: Diet heart healthy/carb modified Room service appropriate?: Yes; Fluid consistency:: Thin  Neurologic Exam: Alert, extubated. Speech fluent without evidence of aphasia. Able to follow 3 step commands without difficulty. Cranial Nerves: II: Discs flat bilaterally; Visual fields grossly normal, pupils equal, round, reactive to light and accommodation III,IV, VI: ptosis not present, extra-ocular motions intact bilaterally V,VII: smile symmetric, facial light touch sensation normal bilaterally VIII: hearing normal bilaterally IX,X: gag reflex present XI: bilateral shoulder shrug XII: midline tongue extension Motor: Lifts all extremities off the bed with no focal weakness noted   Lab Results: Basic Metabolic Panel:  Recent Labs Lab 12/24/14 0610 12/25/14 0253 12/26/14 0355 12/27/14 0410 12/28/14 0415  NA 139 138 140 142 141  K 3.5 3.3* 3.3* 3.2* 3.3*  CL 102 111 108 111 106  CO2 19* 21* GLUCOSE 116* 101* 106* 117* 97  BUN 13 <5* <5* <5* <5*  CREATININE 1.14* 0.73 0.80 0.73 0.70  CALCIUM 9.1 8.1* 8.2* 8.2* 8.5*  MG  --  2.0  --   --   --   PHOS  --  3.4  --   --   --     Liver Function Tests:  Recent Labs Lab  12/24/14 0610 12/25/14 0253  AST 38 23  ALT 28 22  ALKPHOS 91 75  BILITOT 0.3 0.4  PROT 7.9 5.7*  ALBUMIN 3.5 2.8*   No results for input(s): LIPASE, AMYLASE in the last 168 hours. No results for input(s): AMMONIA in the last 168 hours.  CBC:  Recent Labs Lab 12/24/14 0610 12/25/14 0253 12/26/14 0355 12/27/14 0410 12/28/14 0415  WBC 12.5* 7.9 7.8 8.5 6.5  NEUTROABS 8.7*  --   --   --   --   HGB 11.9* 9.9* 10.0* 9.8* 10.1*  HCT 38.1 32.0* 31.9* 30.9* 31.3*  MCV 75.3* 74.1* 74.2* 74.8* 73.8*  PLT 452* 315 322 332 321    Cardiac Enzymes: No results for input(s): CKTOTAL, CKMB, CKMBINDEX, TROPONINI in the last 168 hours.  Lipid Panel:  Recent Labs Lab 12/25/14 0217  TRIG 74    CBG:  Recent Labs Lab 12/27/14 1953 12/27/14 2339 12/28/14 0346 12/28/14 0426 12/28/14 0823  GLUCAP 75 98 67 90 102*    Microbiology: Results for orders placed or performed during the hospital encounter of 12/24/14  MRSA PCR Screening     Status: None   Collection Time: 12/24/14 11:28 AM  Result Value Ref Range Status   MRSA by PCR NEGATIVE NEGATIVE Final    Comment:        The GeneXpert MRSA Assay (FDA approved for NASAL specimens only), is one component of a comprehensive MRSA colonization surveillance  program. It is not intended to diagnose MRSA infection nor to guide or monitor treatment for MRSA infections.   Urine culture     Status: None   Collection Time: 12/24/14 11:31 AM  Result Value Ref Range Status   Specimen Description URINE, CATHETERIZED  Final   Special Requests NONE  Final   Colony Count NO GROWTH Performed at [facility]   Final   Culture NO GROWTH Performed at [facility]   Final   Report Status 12/25/2014 FINAL  Final  Gram stain     Status: None   Collection Time: 12/24/14  3:27 PM  Result Value Ref Range Status   Specimen Description CSF  Final   Special Requests NONE  Final   Gram Stain   Final    WBC PRESENT,  PREDOMINANTLY MONONUCLEAR NO ORGANISMS SEEN CYTOSPIN SLIDE    Report Status 12/24/2014 FINAL  Final  CSF culture     Status: None (Preliminary result)   Collection Time: 12/24/14  3:27 PM  Result Value Ref Range Status   Specimen Description CSF  Final   Special Requests NONE  Final   Gram Stain   Final    WBC PRESENT, PREDOMINANTLY MONONUCLEAR NO ORGANISMS SEEN CYTOSPIN Performed at [facility] Performed at [facility]    Culture   Final    NO GROWTH 2 DAYS Performed at [facility]    Report Status PENDING  Incomplete  Culture, respiratory (tracheal aspirate)     Status: None   Collection Time: 12/24/14  4:17 PM  Result Value Ref Range Status   Specimen Description TRACHEAL ASPIRATE  Final   Special Requests NONE  Final   Gram Stain   Final    FEW WBC PRESENT,BOTH PMN AND MONONUCLEAR RARE SQUAMOUS EPITHELIAL CELLS PRESENT FEW GRAM POSITIVE COCCI IN PAIRS IN CHAINS IN CLUSTERS RARE GRAM NEGATIVE COCCI Performed at [facility]    Culture   Final    FEW STAPHYLOCOCCUS AUREUS Note: RIFAMPIN AND GENTAMICIN SHOULD NOT BE USED AS SINGLE DRUGS FOR TREATMENT OF STAPH INFECTIONS. Performed at [facility]    Report Status 12/27/2014 FINAL  Final   Organism ID, Bacteria STAPHYLOCOCCUS AUREUS  Final      Susceptibility   Staphylococcus aureus - MIC*    CLINDAMYCIN <=0.25 SENSITIVE Sensitive     ERYTHROMYCIN <=0.25 SENSITIVE Sensitive     GENTAMICIN <=0.5 SENSITIVE Sensitive     LEVOFLOXACIN <=0.12 SENSITIVE Sensitive     OXACILLIN 0.5 SENSITIVE Sensitive     PENICILLIN 0.06 SENSITIVE Sensitive     RIFAMPIN <=0.5 SENSITIVE Sensitive     TRIMETH/SULFA <=10 SENSITIVE Sensitive     VANCOMYCIN 1 SENSITIVE Sensitive     TETRACYCLINE <=1 SENSITIVE Sensitive     MOXIFLOXACIN <=0.25 SENSITIVE Sensitive     * FEW STAPHYLOCOCCUS AUREUS  Culture, blood (routine x 2)     Status: None   Collection Time: 12/25/14 12:10 AM  Result Value Ref  Range Status   Specimen Description BLOOD LEFT NECK  Final   Special Requests BOTTLES DRAWN AEROBIC AND ANAEROBIC 10CC  Final   Culture   Final    STAPHYLOCOCCUS SPECIES (COAGULASE NEGATIVE) Note: THE SIGNIFICANCE OF ISOLATING THIS ORGANISM FROM A SINGLE VENIPUNCTURE CANNOT BE PREDICTED WITHOUT FURTHER CLINICAL AND CULTURE CORRELATION. SUSCEPTIBILITIES AVAILABLE ONLY ON REQUEST. Note: Gram Stain Report Called to,Read Back By and Verified With: LAURA CAUDLE [id number]@405AM  BJENN Performed at [facility]    Report Status  12/27/2014 FINAL  Final    Coagulation Studies: No results for input(s): LABPROT, INR in the last 72 hours.  Imaging: Dg Chest Port 1 View  12/27/2014   CLINICAL DATA:  Respiratory failure  EXAM: PORTABLE CHEST - 1 VIEW  COMPARISON:  12/25/2014  FINDINGS: The endotracheal tube and nasogastric tube have been removed. The left jugular central line is withdrawn from its former position. It now does not reach the SVC but it still is within the central circulation, probably reaching the brachiocephalic vein. Mild left base opacity persists. There is no pneumothorax.  IMPRESSION: Left jugular central line is withdrawn from its former position, no longer extending into the SVC but likely reaching the left brachiocephalic vein.  Left base atelectasis or infiltrate.   Electronically Signed   By: Ellery Plunk M.D.   On: 12/27/2014 06:47    Medications:  I have reviewed the patient's current medications. Scheduled: . enoxaparin (LOVENOX) injection  80 mg Subcutaneous Q24H  . levETIRAcetam  500 mg Oral BID  . LORazepam  2 mg Intravenous Once  . vancomycin  1,500 mg Intravenous Q8H    Assessment/Plan: No further seizures noted.  Patient reporting dizziness which may be a side effect to Keppra.    Recommendations: 1.  Vimpat  IV now with maintenance to start at  BID 2.  D/C Keppra 3.  Continue seizure precautions    LOS: 4 days   Thana Farr, MD [facility] [phone] 12/28/2014  9:07 AM

## 2014-12-28 NOTE — Progress Notes (Signed)
Per IV team pt central line is not in the right place according to DG chest view of 12/27/2014. IV vancomycin was stopped and DR Sharon Seller was made aware.

## 2014-12-28 NOTE — Progress Notes (Signed)
Pt blood sugar at 0346 was 67. Gave 120 mls of orange PO. Pt's blood sugar at 0426 was 90. Will continue to monitor and assess.

## 2014-12-28 NOTE — Progress Notes (Signed)
ANTIBIOTIC CONSULT NOTE - INITIAL  Pharmacy Consult for vancomycin Indication: rule out bacteremia  Allergies  Allergen Reactions  . Penicillins Hives, Nausea And Vomiting and Rash  . Pollen Extract Other (See Comments)    Seasonal allergies    Patient Measurements: Height: 5\' 7"  (170.2 cm) Weight: (!) 400 lb (181.439 kg) IBW/kg (Calculated) : 61.6 Adjusted Body Weight:   Vital Signs: Temp: 98.4 F (36.9 C) (06/12 1900) Temp Source: Oral (06/12 1900) BP: 119/65 mmHg (06/12 1900) Pulse Rate: 87 (06/12 1900) Intake/Output from previous day: 06/11 0701 - 06/12 0700 In: 2865 [I.V.:1160; IV Piggyback:1705] Out: 225 [Urine:225] Intake/Output from this shift:    Labs:  Recent Labs  12/26/14 0355 12/27/14 0410 12/28/14 0415  WBC 7.8 8.5 6.5  HGB 10.0* 9.8* 10.1*  PLT 322 332 321  CREATININE 0.80 0.73 0.70   Estimated Creatinine Clearance: 195.5 mL/min (by C-G formula based on Cr of 0.7).  Recent Labs  12/28/14 1700  VANCOTROUGH 19     Medical History: History reviewed. No pertinent past medical history.  Medications:  Prescriptions prior to admission  Medication Sig Dispense Refill Last Dose  . naproxen (NAPROSYN) 500 MG tablet Take 500 mg by mouth daily.   12/23/2014 at Unknown time  . sucralfate (CARAFATE) 1 GM/10ML suspension Take 5 mLs (0.5 g total) by mouth 4 (four) times daily -  with meals and at bedtime. 420 mL 0 12/23/2014 at Unknown time  . ibuprofen (ADVIL,MOTRIN) 600 MG tablet Take 1 tablet (600 mg total) by mouth every 6 (six) hours as needed for pain. (Patient not taking: Reported on 12/24/2014) 30 tablet 0 Not Taking at Unknown time   Assessment: 19 yo female admitted with altered mental status and seizures. Initiating abx for GPC in 1/2 blood cx.   Infectious Disease: WBC 6.5, afebrile, no PCT/LA  6/8 mrsa: neg  6/8 urine cx: neg  6/8 blood cx: Coag neg staph 1/1  6/8 resp cx: MSSA  6/8 LP: neg  6/8 HSV: neg  6/10 vanc >  Trough today was  19  Goal of Therapy:  Vancomycin trough level 15-20 mcg/ml  Plan:  -Continue vanc 1500 mg IV q8h -Monitor renal fx, uop, cultures + sensitivities -Obtain VT to confirm dosing in a few days  Isaac Bliss, PharmD, BCPS Clinical Pharmacist Pager [phone] 12/28/2014 7:43 PM

## 2014-12-28 NOTE — Progress Notes (Signed)
Patient noncompliant with nurse unless family in the room to persuade her otherwise. Patient' mother noted that patient was crying when in the bathroom. Pt's mom also concerned with patient's mood and "defiance" and states she is not sure what is going on with her daughter but she would like for someone to speak to her. Will continue to monitor appropriately. Shatavia Santor, [place] Scrape, RN

## 2014-12-28 NOTE — Progress Notes (Addendum)
IV team unable to get another peripheral IV in this patient. MD paged and PICC line ordered. Patient's mother concerned for the pt to have another central line because of her instability, noncompliance and chance that she will pull it out.   Patient's mother called back in and voiced her concern that she would like her daughter to have the MRI as soon as possible "even if that requires a transfer" and would like her ammonia levels checked. She is concerned that her daughter is getting worse and that this is not her normal behavior. Laurieanne Galloway, [place] Scrape, RN

## 2014-12-29 DIAGNOSIS — T450X2A Poisoning by antiallergic and antiemetic drugs, intentional self-harm, initial encounter: Secondary | ICD-10-CM

## 2014-12-29 DIAGNOSIS — R45851 Suicidal ideations: Secondary | ICD-10-CM

## 2014-12-29 DIAGNOSIS — R7881 Bacteremia: Secondary | ICD-10-CM | POA: Diagnosis present

## 2014-12-29 DIAGNOSIS — F329 Major depressive disorder, single episode, unspecified: Secondary | ICD-10-CM

## 2014-12-29 LAB — IRON AND TIBC
Iron: 75 ug/dL (ref 28–170)
SATURATION RATIOS: 28 % (ref 10.4–31.8)
TIBC: 270 ug/dL (ref 250–450)
UIBC: 195 ug/dL

## 2014-12-29 LAB — COMPREHENSIVE METABOLIC PANEL
ALT: 19 U/L (ref 14–54)
ANION GAP: 7 (ref 5–15)
AST: 25 U/L (ref 15–41)
Albumin: 3 g/dL — ABNORMAL LOW (ref 3.5–5.0)
Alkaline Phosphatase: 74 U/L (ref 38–126)
BILIRUBIN TOTAL: 0.6 mg/dL (ref 0.3–1.2)
BUN: <5 mg/dL — ABNORMAL LOW (ref 6–20)
CHLORIDE: 106 mmol/L (ref 101–111)
CO2: 28 mmol/L (ref 22–32)
CREATININE: 0.72 mg/dL (ref 0.44–1.00)
Calcium: 8.8 mg/dL — ABNORMAL LOW (ref 8.9–10.3)
GFR calc non Af Amer: >60 mL/min (ref >60)
Glucose, Bld: 95 mg/dL (ref 65–99)
Potassium: 3.2 mmol/L — ABNORMAL LOW (ref 3.5–5.1)
Sodium: 141 mmol/L (ref 135–145)
Total Protein: 6.7 g/dL (ref 6.5–8.1)

## 2014-12-29 LAB — CBC WITH DIFFERENTIAL/PLATELET
BASOS ABS: 0 10*3/uL (ref 0.0–0.1)
BASOS PCT: 0 % (ref 0–1)
EOS PCT: 3 % (ref 0–5)
Eosinophils Absolute: 0.2 10*3/uL (ref 0.0–0.7)
HCT: 34.1 % — ABNORMAL LOW (ref 36.0–46.0)
HEMOGLOBIN: 10.9 g/dL — AB (ref 12.0–15.0)
Lymphocytes Relative: 37 % (ref 12–46)
Lymphs Abs: 2 10*3/uL (ref 0.7–4.0)
MCH: 23.3 pg — ABNORMAL LOW (ref 26.0–34.0)
MCHC: 32 g/dL (ref 30.0–36.0)
MCV: 73 fL — ABNORMAL LOW (ref 78.0–100.0)
MONOS PCT: 7 % (ref 3–12)
Monocytes Absolute: 0.4 10*3/uL (ref 0.1–1.0)
NEUTROS ABS: 2.9 10*3/uL (ref 1.7–7.7)
NEUTROS PCT: 53 % (ref 43–77)
Platelets: 342 10*3/uL (ref 150–400)
RBC: 4.67 MIL/uL (ref 3.87–5.11)
RDW: 14.4 % (ref 11.5–15.5)
WBC: 5.5 10*3/uL (ref 4.0–10.5)

## 2014-12-29 LAB — TSH: TSH: 1.62 u[IU]/mL (ref 0.350–4.500)

## 2014-12-29 LAB — RETICULOCYTES
RBC.: 4.47 MIL/uL (ref 3.87–5.11)
Retic Count, Absolute: 67.1 10*3/uL (ref 19.0–186.0)
Retic Ct Pct: 1.5 % (ref 0.4–3.1)

## 2014-12-29 LAB — MAGNESIUM: Magnesium: 1.6 mg/dL — ABNORMAL LOW (ref 1.7–2.4)

## 2014-12-29 LAB — FERRITIN: FERRITIN: 58 ng/mL (ref 11–307)

## 2014-12-29 LAB — VITAMIN B12: VITAMIN B 12: 583 pg/mL (ref 180–914)

## 2014-12-29 LAB — AMMONIA: AMMONIA: 16 umol/L (ref 9–35)

## 2014-12-29 MED ORDER — GUAIFENESIN 100 MG/5ML PO SYRP
200.0000 mg | ORAL_SOLUTION | ORAL | Status: DC | PRN
  Filled 2014-12-29: qty 10

## 2014-12-29 MED ORDER — DULOXETINE HCL 30 MG PO CPEP
30.0000 mg | ORAL_CAPSULE | Freq: Every day | ORAL | Status: DC
  Administered 2014-12-29 – 2014-12-30 (×2): 30 mg via ORAL
  Filled 2014-12-29 (×2): qty 1

## 2014-12-29 MED ORDER — BENZONATATE 100 MG PO CAPS
100.0000 mg | ORAL_CAPSULE | Freq: Three times a day (TID) | ORAL | Status: DC | PRN
  Administered 2014-12-29: 100 mg via ORAL
  Filled 2014-12-29: qty 1

## 2014-12-29 MED ORDER — PHENYTOIN SODIUM EXTENDED 100 MG PO CAPS
300.0000 mg | ORAL_CAPSULE | Freq: Every day | ORAL | Status: DC
  Administered 2014-12-29: 300 mg via ORAL
  Filled 2014-12-29 (×2): qty 3

## 2014-12-29 MED ORDER — TRAZODONE HCL 50 MG PO TABS
50.0000 mg | ORAL_TABLET | Freq: Every day | ORAL | Status: DC
  Administered 2014-12-29: 50 mg via ORAL
  Filled 2014-12-29: qty 1

## 2014-12-29 MED ORDER — PHENYTOIN SODIUM EXTENDED 100 MG PO CAPS
400.0000 mg | ORAL_CAPSULE | Freq: Once | ORAL | Status: AC
  Administered 2014-12-29: 400 mg via ORAL
  Filled 2014-12-29: qty 4

## 2014-12-29 MED ORDER — SODIUM CHLORIDE 0.9 % IJ SOLN
10.0000 mL | INTRAMUSCULAR | Status: DC | PRN
  Administered 2014-12-29: 20 mL
  Filled 2014-12-29: qty 40

## 2014-12-29 NOTE — Progress Notes (Signed)
Subjective: The patient voices no complaints this morning. She just woke up and is somewhat lethargic. The nurse's notes from the events last night were reviewed. She is scheduled to have a PICC line placed this morning. No further seizure activity reported by the nurse with the patient.  Objective: Current vital signs: BP 128/80 mmHg  Pulse 90  Temp(Src) 98.2 F (36.8 C) (Oral)  Resp 20  Ht  (1.702 m)  Wt 166.969 kg (368 lb 1.6 oz)  BMI 57.64 kg/m2  SpO2 95%  LMP 12/22/2014 Vital signs in last 24 hours: Temp:  [97.5 F (36.4 C)-98.6 F (37 C)] 98.2 F (36.8 C) (06/13 0537) Pulse Rate:  [75-105] 90 (06/13 0537) Resp:  [12-22] 20 (06/13 0537) BP: (103-137)/(63-80) 128/80 mmHg (06/13 0537) SpO2:  [95 %-100 %] 95 % (06/13 0537) Weight:  [166.969 kg (368 lb 1.6 oz)] 166.969 kg (368 lb 1.6 oz) (06/13 0438)  Intake/Output from previous day: 06/12 0701 - 06/13 0700 In: 845 [I.V.:300; IV Piggyback:545] Out: -  Intake/Output this shift:   Nutritional status: Diet heart healthy/carb modified Room service appropriate?: Yes; Fluid consistency:: Thin  Physical Exam  Neurologic Exam:  MENTAL STATUS: awake lethargic,  Language fluent Follows simple commands. Naming intact  Oriented except for day of the month CRANIAL NERVES: pupils equal and reactive to light, extraocular muscles intact, no nystagmus, strength symmetric. MOTOR: normal bulk and tone, full strength in the BUE, BLE  SENSORY: normal and symmetric to light touch  COORDINATION: finger-nose-finger normal     Lab Results: Basic Metabolic Panel:  Recent Labs Lab 12/24/14 0610 12/25/14 0253 12/26/14 0355 12/27/14 0410 12/28/14 0415  NA 139 138 140 142 141  K 3.5 3.3* 3.3* 3.2* 3.3*  CL 102 111 108 111 106  CO2 19* 21* GLUCOSE 116* 101* 106* 117* 97  BUN 13 <5* <5* <5* <5*  CREATININE 1.14* 0.73 0.80 0.73 0.70  CALCIUM 9.1 8.1* 8.2* 8.2* 8.5*  MG  --  2.0  --   --   --   PHOS  --  3.4  --   --    --     Liver Function Tests:  Recent Labs Lab 12/24/14 0610 12/25/14 0253  AST 38 23  ALT 28 22  ALKPHOS 91 75  BILITOT 0.3 0.4  PROT 7.9 5.7*  ALBUMIN 3.5 2.8*   No results for input(s): LIPASE, AMYLASE in the last 168 hours. No results for input(s): AMMONIA in the last 168 hours.  CBC:  Recent Labs Lab 12/24/14 0610 12/25/14 0253 12/26/14 0355 12/27/14 0410 12/28/14 0415  WBC 12.5* 7.9 7.8 8.5 6.5  NEUTROABS 8.7*  --   --   --   --   HGB 11.9* 9.9* 10.0* 9.8* 10.1*  HCT 38.1 32.0* 31.9* 30.9* 31.3*  MCV 75.3* 74.1* 74.2* 74.8* 73.8*  PLT 452* 315 322 332 321    Cardiac Enzymes: No results for input(s): CKTOTAL, CKMB, CKMBINDEX, TROPONINI in the last 168 hours.  Lipid Panel:  Recent Labs Lab 12/25/14 0217  TRIG 74    CBG:  Recent Labs Lab 12/28/14 0346 12/28/14 0426 12/28/14 0823 12/28/14 1225 12/28/14 1628  GLUCAP 67 90 102* 84 76    Microbiology: Results for orders placed or performed during the hospital encounter of 12/24/14  MRSA PCR Screening     Status: None   Collection Time: 12/24/14 11:28 AM  Result Value Ref Range Status   MRSA by PCR NEGATIVE NEGATIVE Final  Comment:        The GeneXpert MRSA Assay (FDA approved for NASAL specimens only), is one component of a comprehensive MRSA colonization surveillance program. It is not intended to diagnose MRSA infection nor to guide or monitor treatment for MRSA infections.   Urine culture     Status: None   Collection Time: 12/24/14 11:31 AM  Result Value Ref Range Status   Specimen Description URINE, CATHETERIZED  Final   Special Requests NONE  Final   Colony Count NO GROWTH Performed at [facility]   Final   Culture NO GROWTH Performed at [facility]   Final   Report Status 12/25/2014 FINAL  Final  Gram stain     Status: None   Collection Time: 12/24/14  3:27 PM  Result Value Ref Range Status   Specimen Description CSF  Final   Special Requests NONE   Final   Gram Stain   Final    WBC PRESENT, PREDOMINANTLY MONONUCLEAR NO ORGANISMS SEEN CYTOSPIN SLIDE    Report Status 12/24/2014 FINAL  Final  CSF culture     Status: None   Collection Time: 12/24/14  3:27 PM  Result Value Ref Range Status   Specimen Description CSF  Final   Special Requests NONE  Final   Gram Stain   Final    WBC PRESENT, PREDOMINANTLY MONONUCLEAR NO ORGANISMS SEEN CYTOSPIN Performed at [facility] Performed at [facility]    Culture   Final    NO GROWTH 3 DAYS Performed at [facility]    Report Status 12/28/2014 FINAL  Final  Culture, respiratory (tracheal aspirate)     Status: None   Collection Time: 12/24/14  4:17 PM  Result Value Ref Range Status   Specimen Description TRACHEAL ASPIRATE  Final   Special Requests NONE  Final   Gram Stain   Final    FEW WBC PRESENT,BOTH PMN AND MONONUCLEAR RARE SQUAMOUS EPITHELIAL CELLS PRESENT FEW GRAM POSITIVE COCCI IN PAIRS IN CHAINS IN CLUSTERS RARE GRAM NEGATIVE COCCI Performed at [facility]    Culture   Final    FEW STAPHYLOCOCCUS AUREUS Note: RIFAMPIN AND GENTAMICIN SHOULD NOT BE USED AS SINGLE DRUGS FOR TREATMENT OF STAPH INFECTIONS. Performed at [facility]    Report Status 12/27/2014 FINAL  Final   Organism ID, Bacteria STAPHYLOCOCCUS AUREUS  Final      Susceptibility   Staphylococcus aureus - MIC*    CLINDAMYCIN <=0.25 SENSITIVE Sensitive     ERYTHROMYCIN <=0.25 SENSITIVE Sensitive     GENTAMICIN <=0.5 SENSITIVE Sensitive     LEVOFLOXACIN <=0.12 SENSITIVE Sensitive     OXACILLIN 0.5 SENSITIVE Sensitive     PENICILLIN 0.06 SENSITIVE Sensitive     RIFAMPIN <=0.5 SENSITIVE Sensitive     TRIMETH/SULFA <=10 SENSITIVE Sensitive     VANCOMYCIN 1 SENSITIVE Sensitive     TETRACYCLINE <=1 SENSITIVE Sensitive     MOXIFLOXACIN <=0.25 SENSITIVE Sensitive     * FEW STAPHYLOCOCCUS AUREUS  Culture, blood (routine x 2)     Status: None   Collection Time: 12/25/14  12:10 AM  Result Value Ref Range Status   Specimen Description BLOOD LEFT NECK  Final   Special Requests BOTTLES DRAWN AEROBIC AND ANAEROBIC 10CC  Final   Culture   Final    STAPHYLOCOCCUS SPECIES (COAGULASE NEGATIVE) Note: THE SIGNIFICANCE OF ISOLATING THIS ORGANISM FROM A SINGLE VENIPUNCTURE CANNOT BE PREDICTED WITHOUT FURTHER CLINICAL AND CULTURE CORRELATION. SUSCEPTIBILITIES AVAILABLE ONLY ON  REQUEST. Note: Gram Stain Report Called to,Read Back By and Verified With: LAURA CAUDLE [id number]  BJENN Performed at [facility]    Report Status 12/27/2014 FINAL  Final    Coagulation Studies: No results for input(s): LABPROT, INR in the last 72 hours.  Imaging:  CT head without contrast 12/24/2014 IMPRESSION: 1. No acute intracranial abnormality. 2. Nonstandard imaging plane secondary to body habitus.   Medications:  Scheduled: . enoxaparin (LOVENOX) injection  80 mg Subcutaneous Q24H  . lacosamide  100 mg Oral BID  . vancomycin  1,500 mg Intravenous Q8H    Assessment/Plan: The patient's case was discussed with Dr. Lendell Caprice today. The patient's mother would like her to have an MRI of the brain as well as an ammonia level. Unfortunately the patient will not fit in the hospital MRI scanners. This could be performed on an outpatient basis. Dr. Lendell Caprice is contemplating a psychiatric consult. Again no further seizure activity as been noted. Continue Vimpat 100 mg twice daily.  I spoke with the case manager for this patient. Apparently the patient does not have medical insurance and will not be able to afford Vimpat. The case manager will look at other payment options; although, may need to consider a different medication.   Delton See PA-C [facility] Pager [phone] 12/29/2014, 8:31 AM   Patient had episode of significant agitation last night, feeling better now. Of note, she is self-pay.  She had no prodrome other than the sore throat and cough  prior to her seizure flurry. It is certainly possible that she is predisposed and had a seizure flurry due to lower threshold due to infection.   No history of episodes concerning for previous seizures or autoimmune disease.   Her episode of agitation last night she describes as a sensation of "being angry" and felt that she was getting angry at things she would not normally get angry about.    1) given concerns of paying for vimpat, will change to dilantin  qhs. Will give  additional dose now as small load.  2) MRI will need to be done, but with negative CT, I do not feel that this will need to be done as an inpatient given that she cannot fit in the scanner(per previous notes) 3) Patient is unable to drive, operate heavy machinery, perform activities at heights or participate in water activities until release by outpatient physician. This was discussed with the patient who expressed understanding.  4) I advised the patient to pursue birth control while taking dilantin and discussed that there can be detrimental effects to the fetus if she becomes pregnant while taking this.   Ritta Slot, MD [facility] [phone]  If 7pm- 7am, please page neurology on call as listed in [facility].

## 2014-12-29 NOTE — Progress Notes (Signed)
Met with patient and spoke with her mother via phone.  Per patient's mother, they have been contacted by Roland Earl with [employer] as well as Saintclair Halsted with [id number].  Patient's mother has been instructed on how to obtain an "orange card" to assist with medications.  Mother states that patient may potentially discharge home tomorrow, but she will not be able to make arrangements for the "orange card" until the following day.  Patient's mother was informed that medication list would be evaluated and CM would ensure that patient had appropriate access to her medications at discharge. Patient and mother gave permission for follow-up appointment at the [facility] and [facility].  CM will make appointment at time of discharge.  Note left on chart for attending MD requesting a letter for patient to give to her college regarding her inability to complete her summer semester.

## 2014-12-29 NOTE — Progress Notes (Signed)
Chaplain responded to spiritual care consult and referral to nurse. Chaplain introduced herself to pt and pt mother. PT not very talkative at this time. Chaplain informed pt and family of her services should she need them. Chaplain will continue to follow.   12/29/14 1300  Clinical Encounter Type  Visited With Patient and family together  Visit Type Initial;Spiritual support  Referral From Nurse  Spiritual Encounters  Spiritual Needs Emotional  Sakura Denis, Loa Socks 12/29/2014 1:55 PM

## 2014-12-29 NOTE — Progress Notes (Signed)
Peripherally Inserted Central Catheter/Midline Placement  The IV Nurse has discussed with the patient and/or persons authorized to consent for the patient, the purpose of this procedure and the potential benefits and risks involved with this procedure.  The benefits include less needle sticks, lab draws from the catheter and patient may be discharged home with the catheter.  Risks include, but not limited to, infection, bleeding, blood clot (thrombus formation), and puncture of an artery; nerve damage and irregular heat beat.  Alternatives to this procedure were also discussed.  PICC/Midline Placement Documentation        Lisabeth Devoid 12/29/2014, 9:19 AM Phone consent obtained from mother by Merleen Milliner, RN, CRNI and myself

## 2014-12-29 NOTE — Progress Notes (Signed)
Physical Therapy Treatment Patient Details Name: Makayla Livingston MRN: [id number] DOB: [birth year] Today's Date: 12/29/2014    History of Present Illness 19 y/o F with no known PMH who presented to [facility] ER on 6/8 with AMS. Developed seizures in ER and subsequently intubated. PMH: morbidly obese. Head CT with no acute abnormality.    PT Comments    Pt ambulated 120 ft w/ 1 person HHA but demonstrated staggering to the L and R and appears unaware of deficits.  Pt completed 2 steps using R rail but demonstrated ant/post/B lateral sway when holding onto rail.  Pt will need to complete 3 flights of stairs before returning home.  Pt will benefit from continued skilled PT services to increase functional independence and safety.   Follow Up Recommendations  Home health PT;Supervision/Assistance - 24 hour (may need snf is she can't do 3 flights of stairs)     Equipment Recommendations  Rolling walker with 5" wheels (Bariatric)    Recommendations for Other Services       Precautions / Restrictions Precautions Precautions: Fall Restrictions Weight Bearing Restrictions: No    Mobility  Bed Mobility Overal bed mobility: Modified Independent             General bed mobility comments: Increased time w/ use of bed rails.  Transfers Overall transfer level: Needs assistance Equipment used: 1 person hand held assist Transfers: Sit to/from Stand Sit to Stand: Min assist         General transfer comment: Pt w/ some ant/post sway upon standing, but able to maintain balance w/ 1 person HHA.  Ambulation/Gait Ambulation/Gait assistance: +2 safety/equipment;Min assist Ambulation Distance (Feet): 120 Feet Assistive device: 1 person hand held assist Gait Pattern/deviations: Step-through pattern;Decreased stride length;Antalgic;Staggering left;Staggering right Gait velocity: slow Gait velocity interpretation: Below normal speed for age/gender General Gait Details: pt staggering to L and R  w/ occasional LEs crossing over midline.  Pt denies dizziness.   Stairs Stairs: Yes Stairs assistance: Min guard Stair Management: One rail Right;Step to pattern;Alternating pattern;Forwards Number of Stairs: 2 (x2) General stair comments: Cues to hold onto railing.  Pt w/ ant/post/lat sway while holding onto railing.  Wheelchair Mobility    Modified Rankin (Stroke Patients Only)       Balance Overall balance assessment: Needs assistance Sitting-balance support: No upper extremity supported;Feet supported Sitting balance-Leahy Scale: Fair     Standing balance support: Single extremity supported;During functional activity Standing balance-Leahy Scale: Poor                      Cognition Arousal/Alertness: Awake/alert Behavior During Therapy: Flat affect Overall Cognitive Status: Impaired/Different from baseline Area of Impairment: Problem solving;Following commands       Following Commands: Follows one step commands with increased time     Problem Solving: Slow processing      Exercises General Exercises - Lower Extremity Ankle Circles/Pumps: AROM;Both;15 reps;Seated Long Arc Quad: AROM;Both;15 reps;Seated Other Exercises Other Exercises: Marching in sitting. 10 reps BLEs    General Comments General comments (skin integrity, edema, etc.): Pt does not seem aware of deficits during ambulation.      Pertinent Vitals/Pain Pain Assessment: No/denies pain    Home Living                      Prior Function            PT Goals (current goals can now be found in the care plan section) Acute Rehab  PT Goals Patient Stated Goal: to go home PT Goal Formulation: With patient/family Time For Goal Achievement: 01/03/15 Potential to Achieve Goals: Good Progress towards PT goals: Progressing toward goals    Frequency  Min 3X/week    PT Plan Current plan remains appropriate    Co-evaluation             End of Session Equipment Utilized  During Treatment: Gait belt Activity Tolerance: Patient limited by fatigue Patient left: in chair;with call bell/phone within reach;with chair alarm set     Time: 1351-1408 PT Time Calculation (min) (ACUTE ONLY): 17 min  Charges:  $Gait Training: 8-22 mins                    G Codes:      Michail Jewels PT, [place] [phone] Pager: [phone] 12/29/2014, 4:27 PM

## 2014-12-29 NOTE — Progress Notes (Addendum)
Progress Note  Makayla Livingston:[id number] DOB: 1996-04-21 DOA: 12/24/2014 PCP: Lyda Perone, MD   Admit HPI / Brief Narrative: Chart reviewed. 19 y/o F with no known PMH who on 6/7 presented to ER with complaints of sores on her tongue that began 6/6 with associated mouth pain. She was treated with carafate and discharged.  At baseline, she lives with her mother and is a Consulting civil engineer at [employer] She is studying biochemistry and psychology. Mother denies known drug use. The patient has one brother who is healthy. No known bug bites or wounds. Non-smoker.  After she was was seen in ER on 6/7 she went home and was in her usual state of health. The mother reports the am of 6/8 she woke to a loud noise and found her daughter on the floor with a bookshelf on top of her. She was altered and unable to re-orient her. EMS was activated and was witnessed to have 2 seizures.   In the ER she had 4 more generalized full body seizures. She was treated with IV ativan and haldol for agitation. She continued to have seizures and decision was made to intubate for airway protection. She had a total of 7 seizures between EMS and in the ER. The patient had significant agitation requiring 4 point restraints. Placed on propofol and CT head was negative. overnight, became uncooperative and agitated.  Significant Events: 6/07 Seen in ER for oral lesions, treated with carafate & d/c'd 6/08Admitted to [facility] for AMS, developed seizures in ER and intubated.  6/9 MRI could not be done due to size  Assessment/Plan:  New onset Seizures MRI as outpt (cannot be done as inpt due to size - needs open MRI) - none further. Neurology adjusting medications. Vimpat is expensive and pt is uninsured. Will likely change to dilantin.  Acute agitation occurred overnight:  Much improved.  D/w neurology. May have been a reaction to Keppra.  Patient's mother is requesting a psychiatry consult. I suspect this episode was related to  medications, but patient does endorse feelings of depression and would like to proceed. Will consult.  Acute Encephalopathy Improved. B12, folate, ammonia, HIV pending  Herpangina seen in ER 6/7   Coag-negative staph bacteremia - Staph aureus in trach aspirate Positive blood culture is likely contaminant. Will discontinue antibiotics, repeat blood cultures and monitor. Significance of staph aureus and tracheal aspirate is unclear.   Hypokalemia Replace and follow   Sinus Tachycardia  Resolved   Acute Kidney Injury Resolved  Morbid Obesity  Code Status: FULL Family Communication: mother Disposition Plan:  hopefully, home in 1-2 days if stable.  Consultants: PCCM Neurology  Antibiotics: Vanc 6/10 > 6/13  DVT prophylaxis: lovenox   HPI/Subjective: Patient is feeling better today. Pt reports feeling like she was "in a dream". Still feels slightly abnormal but improved. She remembers feeling upset with her brother. Denies alcohol or drug use. Does endorse feelings of depression and would like to speak with psychiatry.   Objective: Blood pressure 128/80, pulse 90, temperature 98.2 F (36.8 C), temperature source Oral, resp. rate 20, height 5\' 7"  (1.702 m), weight 166.969 kg (368 lb 1.6 oz), last menstrual period 12/22/2014, SpO2 95 %.  Intake/Output Summary (Last 24 hours) at 12/29/14 0837 Last data filed at 12/28/14 1300  Gross per 24 hour  Intake    795 ml  Output      0 ml  Net    795 ml   Exam: General: calm, cooperative. Ambulating in the room without  difficulty. Appropriate and oriented.  Lungs: Clear to auscultation bilaterally without wheeze rhonchi or rales.  Cardiovascular: Regular rate and rhythm without murmur gallop or rub normal S1 and S2 Abdomen: Nontender, morbidly obese, soft, bowel sounds positive, no rebound, no ascites, no appreciable mass Extremities: No significant cyanosis, clubbing, or edema bilateral lower extremities  Data  Reviewed: Basic Metabolic Panel:  Recent Labs Lab 12/24/14 0610 12/25/14 0253 12/26/14 0355 12/27/14 0410 12/28/14 0415  NA 139 138 140 142 141  K 3.5 3.3* 3.3* 3.2* 3.3*  CL 102 111 108 111 106  CO2 19* 21* GLUCOSE 116* 101* 106* 117* 97  BUN 13 <5* <5* <5* <5*  CREATININE 1.14* 0.73 0.80 0.73 0.70  CALCIUM 9.1 8.1* 8.2* 8.2* 8.5*  MG  --  2.0  --   --   --   PHOS  --  3.4  --   --   --     CBC:  Recent Labs Lab 12/24/14 0610 12/25/14 0253 12/26/14 0355 12/27/14 0410 12/28/14 0415  WBC 12.5* 7.9 7.8 8.5 6.5  NEUTROABS 8.7*  --   --   --   --   HGB 11.9* 9.9* 10.0* 9.8* 10.1*  HCT 38.1 32.0* 31.9* 30.9* 31.3*  MCV 75.3* 74.1* 74.2* 74.8* 73.8*  PLT 452* 315 322 332 321    Liver Function Tests:  Recent Labs Lab 12/24/14 0610 12/25/14 0253  AST 38 23  ALT 28 22  ALKPHOS 91 75  BILITOT 0.3 0.4  PROT 7.9 5.7*  ALBUMIN 3.5 2.8*   CBG:  Recent Labs Lab 12/28/14 0346 12/28/14 0426 12/28/14 0823 12/28/14 1225 12/28/14 1628  GLUCAP 67 90 102* 84 76    Recent Results (from the past 240 hour(s))  MRSA PCR Screening     Status: None   Collection Time: 12/24/14 11:28 AM  Result Value Ref Range Status   MRSA by PCR NEGATIVE NEGATIVE Final    Comment:        The GeneXpert MRSA Assay (FDA approved for NASAL specimens only), is one component of a comprehensive MRSA colonization surveillance program. It is not intended to diagnose MRSA infection nor to guide or monitor treatment for MRSA infections.   Urine culture     Status: None   Collection Time: 12/24/14 11:31 AM  Result Value Ref Range Status   Specimen Description URINE, CATHETERIZED  Final   Special Requests NONE  Final   Colony Count NO GROWTH Performed at [facility]   Final   Culture NO GROWTH Performed at [facility]   Final   Report Status 12/25/2014 FINAL  Final  Gram stain     Status: None   Collection Time: 12/24/14  3:27 PM  Result Value Ref Range  Status   Specimen Description CSF  Final   Special Requests NONE  Final   Gram Stain   Final    WBC PRESENT, PREDOMINANTLY MONONUCLEAR NO ORGANISMS SEEN CYTOSPIN SLIDE    Report Status 12/24/2014 FINAL  Final  CSF culture     Status: None   Collection Time: 12/24/14  3:27 PM  Result Value Ref Range Status   Specimen Description CSF  Final   Special Requests NONE  Final   Gram Stain   Final    WBC PRESENT, PREDOMINANTLY MONONUCLEAR NO ORGANISMS SEEN CYTOSPIN Performed at [facility] Performed at [facility]    Culture   Final    NO GROWTH 3 DAYS Performed  at [facility]    Report Status 12/28/2014 FINAL  Final  Culture, respiratory (tracheal aspirate)     Status: None   Collection Time: 12/24/14  4:17 PM  Result Value Ref Range Status   Specimen Description TRACHEAL ASPIRATE  Final   Special Requests NONE  Final   Gram Stain   Final    FEW WBC PRESENT,BOTH PMN AND MONONUCLEAR RARE SQUAMOUS EPITHELIAL CELLS PRESENT FEW GRAM POSITIVE COCCI IN PAIRS IN CHAINS IN CLUSTERS RARE GRAM NEGATIVE COCCI Performed at [facility]    Culture   Final    FEW STAPHYLOCOCCUS AUREUS Note: RIFAMPIN AND GENTAMICIN SHOULD NOT BE USED AS SINGLE DRUGS FOR TREATMENT OF STAPH INFECTIONS. Performed at [facility]    Report Status 12/27/2014 FINAL  Final   Organism ID, Bacteria STAPHYLOCOCCUS AUREUS  Final      Susceptibility   Staphylococcus aureus - MIC*    CLINDAMYCIN <=0.25 SENSITIVE Sensitive     ERYTHROMYCIN <=0.25 SENSITIVE Sensitive     GENTAMICIN <=0.5 SENSITIVE Sensitive     LEVOFLOXACIN <=0.12 SENSITIVE Sensitive     OXACILLIN 0.5 SENSITIVE Sensitive     PENICILLIN 0.06 SENSITIVE Sensitive     RIFAMPIN <=0.5 SENSITIVE Sensitive     TRIMETH/SULFA <=10 SENSITIVE Sensitive     VANCOMYCIN 1 SENSITIVE Sensitive     TETRACYCLINE <=1 SENSITIVE Sensitive     MOXIFLOXACIN <=0.25 SENSITIVE Sensitive     * FEW STAPHYLOCOCCUS AUREUS  Culture,  blood (routine x 2)     Status: None   Collection Time: 12/25/14 12:10 AM  Result Value Ref Range Status   Specimen Description BLOOD LEFT NECK  Final   Special Requests BOTTLES DRAWN AEROBIC AND ANAEROBIC 10CC  Final   Culture   Final    STAPHYLOCOCCUS SPECIES (COAGULASE NEGATIVE) Note: THE SIGNIFICANCE OF ISOLATING THIS ORGANISM FROM A SINGLE VENIPUNCTURE CANNOT BE PREDICTED WITHOUT FURTHER CLINICAL AND CULTURE CORRELATION. SUSCEPTIBILITIES AVAILABLE ONLY ON REQUEST. Note: Gram Stain Report Called to,Read Back By and Verified With: LAURA CAUDLE [id number]@405AM  BJENN Performed at [facility]    Report Status 12/27/2014 FINAL  Final    Studies:   Recent x-ray studies have been reviewed in detail by the Attending Physician  Scheduled Meds:  Scheduled Meds: . enoxaparin (LOVENOX) injection  80 mg Subcutaneous Q24H  . lacosamide  100 mg Oral BID  . vancomycin  1,500 mg Intravenous Q8H    Time spent on care of this patient: 35 mins   Christiane Ha , MD   [facility] [phone] [email or web address] Password [facility] 12/29/2014, 8:37 AM   LOS: 5 days

## 2014-12-29 NOTE — Consult Note (Signed)
[place] Psychiatry Consult   Reason for Consult:  depression Referring Physician:  Dr. Conley Canal Patient Identification: [name] MRN:  [id number] Principal Diagnosis: New onset seizure Diagnosis:   Patient Active Problem List   Diagnosis Date Noted  . Bacteremia [R78.81] 12/29/2014  . Acute respiratory failure [J96.00]   . New onset seizure [R56.9]   . Acute encephalopathy [G93.40] 12/24/2014    Total Time spent with patient: 1 hour  Subjective:   Makayla Livingston is a 19 y.o. female patient admitted with depression and new onset of seizure.  HPI:  Makayla Livingston is a 19 y/o Female, student of [employer] and staying with her mother. Patient reportedly feeling depressed and has passive suicide ideations. She had one episode of witting a letter about being disappointing her mother and took benadryl 8 tabs with intention to sleep and not to wake up. Patient mother was able to talk her out of it. She continue to have depressed and stressed mood, trying to loose weight, disturbed sleep and decreased oral intake. She reportedly eating one sand witch a day and drinking two cups of 16 hours of tea a day. She has new onset of seizure. She has loss of interest in her daily life, not able to care for her room, reportedly hoarding a lot of trash, not even able to care for her personal hygiene. She has been stressed about her weight, relationship, school, family and no job. She is willing to take medication for depression and insomnia. She is willing to obtain professional service to reduce her weight. She wishes to get into her ideal body weight. She has no family history of depression or seizure disorder. She has no history of substance abuse or past psych history.   HPI Elements:   Location:  depression. Quality:  poor. Severity:  unable to cope up with stress, disturbed sleep and low grades in school.. Timing:  unknown. Duration:  six months. Context:  psychosocial stresses.  Past Medical  History: History reviewed. No pertinent past medical history. History reviewed. No pertinent past surgical history. Family History: History reviewed. No pertinent family history. Social History:  History  Alcohol Use No     History  Drug Use No    History   Social History  . Marital Status: Single    Spouse Name: N/A  . Number of Children: N/A  . Years of Education: N/A   Social History Main Topics  . Smoking status: Never Smoker   . Smokeless tobacco: Not on file  . Alcohol Use: No  . Drug Use: No  . Sexual Activity: Not on file   Other Topics Concern  . None   Social History Narrative   Additional Social History:                          Allergies:   Allergies  Allergen Reactions  . Penicillins Hives, Nausea And Vomiting and Rash  . Pollen Extract Other (See Comments)    Seasonal allergies    Labs:  Results for orders placed or performed during the hospital encounter of 12/24/14 (from the past 48 hour(s))  Glucose, capillary     Status: None   Collection Time: 12/27/14 12:57 PM  Result Value Ref Range   Glucose-Capillary 86 65 - 99 mg/dL  Glucose, capillary     Status: None   Collection Time: 12/27/14  4:00 PM  Result Value Ref Range   Glucose-Capillary 99 65 - 99  mg/dL  Glucose, capillary     Status: None   Collection Time: 12/27/14  7:53 PM  Result Value Ref Range   Glucose-Capillary 75 65 - 99 mg/dL  Glucose, capillary     Status: None   Collection Time: 12/27/14 11:39 PM  Result Value Ref Range   Glucose-Capillary 98 65 - 99 mg/dL  Glucose, capillary     Status: None   Collection Time: 12/28/14  3:46 AM  Result Value Ref Range   Glucose-Capillary 67 65 - 99 mg/dL   Comment 1 Notify RN   CBC     Status: Abnormal   Collection Time: 12/28/14  4:15 AM  Result Value Ref Range   WBC 6.5 4.0 - 10.5 K/uL   RBC 4.24 3.87 - 5.11 MIL/uL   Hemoglobin 10.1 (L) 12.0 - 15.0 g/dL   HCT 31.3 (L) 36.0 - 46.0 %   MCV 73.8 (L) 78.0 - 100.0 fL   MCH  23.8 (L) 26.0 - 34.0 pg   MCHC 32.3 30.0 - 36.0 g/dL   RDW 14.9 11.5 - 15.5 %   Platelets 321 150 - 400 K/uL  Basic metabolic panel     Status: Abnormal   Collection Time: 12/28/14  4:15 AM  Result Value Ref Range   Sodium 141 135 - 145 mmol/L   Potassium 3.3 (L) 3.5 - 5.1 mmol/L   Chloride 106 101 - 111 mmol/L   CO2 26 22 - 32 mmol/L   Glucose, Bld 97 65 - 99 mg/dL   BUN <5 (L) 6 - 20 mg/dL   Creatinine, Ser 0.70 0.44 - 1.00 mg/dL   Calcium 8.5 (L) 8.9 - 10.3 mg/dL   GFR calc non Af Amer >60 >60 mL/min   GFR calc Af Amer >60 >60 mL/min    Comment: (NOTE) The eGFR has been calculated using the CKD EPI equation. This calculation has not been validated in all clinical situations. eGFR's persistently <60 mL/min signify possible Chronic Kidney Disease.    Anion gap 9 5 - 15  Glucose, capillary     Status: None   Collection Time: 12/28/14  4:26 AM  Result Value Ref Range   Glucose-Capillary 90 65 - 99 mg/dL  Glucose, capillary     Status: Abnormal   Collection Time: 12/28/14  8:23 AM  Result Value Ref Range   Glucose-Capillary 102 (H) 65 - 99 mg/dL  Glucose, capillary     Status: None   Collection Time: 12/28/14 12:25 PM  Result Value Ref Range   Glucose-Capillary 84 65 - 99 mg/dL  Glucose, capillary     Status: None   Collection Time: 12/28/14  4:28 PM  Result Value Ref Range   Glucose-Capillary 76 65 - 99 mg/dL  Vancomycin, trough     Status: None   Collection Time: 12/28/14  5:00 PM  Result Value Ref Range   Vancomycin Tr 19 10.0 - 20.0 ug/mL  Reticulocytes     Status: None   Collection Time: 12/29/14  9:15 AM  Result Value Ref Range   Retic Ct Pct 1.5 0.4 - 3.1 %   RBC. 4.47 3.87 - 5.11 MIL/uL   Retic Count, Manual 67.1 19.0 - 186.0 K/uL    Vitals: Blood pressure 110/74, pulse 105, temperature 98.9 F (37.2 C), temperature source Oral, resp. rate 20, height _0  (1.702 m), weight 166.969 kg (368 lb 1.6 oz), last menstrual period 12/22/2014, SpO2 98 %.  Risk to  Self: Is patient at risk for suicide?: No  Risk to Others:   Prior Inpatient Therapy:   Prior Outpatient Therapy:    Current Facility-Administered Medications  Medication Dose Route Frequency Provider Last Rate Last Dose  . 0.9 %  sodium chloride infusion   Intravenous Continuous Colbert Coyer, MD 10 mL/hr at 12/27/14 1800    . acetaminophen (TYLENOL) tablet 650 mg  650 mg Oral Q6H PRN Cherene Altes, MD      . albuterol (PROVENTIL) (2.5 MG/3ML) 0.083% nebulizer solution 2.5 mg  2.5 mg Nebulization Q4H PRN Colbert Coyer, MD   2.5 mg at 12/29/14 0430  . benzonatate (TESSALON) capsule 100 mg  100 mg Oral TID PRN Delfina Redwood, MD      . enoxaparin (LOVENOX) injection 80 mg  80 mg Subcutaneous Q24H Kara Mead V, MD   80 mg at 12/29/14 0954  . guaifenesin (ROBITUSSIN) 100 MG/5ML syrup 200 mg  200 mg Oral Q4H PRN Delfina Redwood, MD      . LORazepam (ATIVAN) injection 2 mg  2 mg Intravenous PRN Donita Brooks, NP      . LORazepam (ATIVAN) tablet 0.5 mg  0.5 mg Oral Q4H PRN Cherene Altes, MD   0.5 mg at 12/28/14 2203  . magic mouthwash w/lidocaine  5 mL Oral TID PRN Cherene Altes, MD      . phenytoin (DILANTIN) ER capsule 300 mg  300 mg Oral QHS Greta Doom, MD      . phenytoin (DILANTIN) ER capsule 400 mg  400 mg Oral Once Greta Doom, MD      . sodium chloride 0.9 % injection 10-40 mL  10-40 mL Intracatheter PRN Cherene Altes, MD      . sodium chloride 0.9 % injection 10-40 mL  10-40 mL Intracatheter PRN Delfina Redwood, MD        Musculoskeletal: Strength & Muscle Tone: decreased Gait & Station: unable to stand Patient leans: N/A  Psychiatric Specialty Exam: Physical Exam as per history and physical  ROS depression, sad, unhappy, disturbance of sleep, decreased oral intake, obesity, SOB and recent onset seizures.  No Fever-chills, No Headache, No changes with Vision or hearing, reports vertigo No problems swallowing food or  Liquids, No Chest pain, Cough or Shortness of Breath, No Abdominal pain, No Nausea or Vommitting, Bowel movements are regular, No Blood in stool or Urine, No dysuria, No new skin rashes or bruises, No new joints pains-aches,  No new weakness, tingling, numbness in any extremity, No recent weight gain or loss, No polyuria, polydypsia or polyphagia,   A full 10 point Review of Systems was done, except as stated above, all other Review of Systems were negative.  Blood pressure 110/74, pulse 105, temperature 98.9 F (37.2 C), temperature source Oral, resp. rate 20, height _0  (1.702 m), weight 166.969 kg (368 lb 1.6 oz), last menstrual period 12/22/2014, SpO2 98 %.Body mass index is 57.64 kg/(m^2).  General Appearance: Guarded  Eye Contact::  Good  Speech:  Clear and Coherent  Volume:  Decreased  Mood:  Anxious and Depressed  Affect:  Constricted and Depressed  Thought Process:  Coherent and Goal Directed  Orientation:  Full (Time, Place, and Person)  Thought Content:  Rumination  Suicidal Thoughts:  No  Homicidal Thoughts:  No  Memory:  Immediate;   Good Recent;   Good  Judgement:  Intact  Insight:  Fair  Psychomotor Activity:  Decreased  Concentration:  Fair  Recall:  Good  Fund of  Knowledge:Good  Language: Good  Akathisia:  Negative  Handed:  Right  AIMS (if indicated):     Assets:  Communication Skills Desire for Improvement Financial Resources/Insurance Housing Intimacy Leisure Time Resilience Social Support Talents/Skills Transportation Vocational/Educational  ADL's:  Impaired  Cognition: WNL  Sleep:      Medical Decision Making: New problem, with additional work up planned, Review of Psycho-Social Stressors (1), Review or order clinical lab tests (1), Review of Last Therapy Session (1), Review or order medicine tests (1), Review of Medication Regimen & Side Effects (2) and Review of New Medication or Change in Dosage (2)  Treatment Plan Summary: Presented  with symptoms of depression and multiple stresses about her weight gain, relationship with her brother, school stresses etc. She has seasonal allergies and she is taking over the counter benadryl, one episode of overddose. She denied current suicide/homicide ideation, intention or plans. Patient contract for safety and patient mom has been supportive to her.  Daily contact with patient to assess and evaluate symptoms and progress in treatment and Medication management  Plan: Safety: patient contract for safety and has plans of completing education, finding a job and loosing weight etc.  Start Cymbalta 30 mg PO Qam for depression and Trazodone 50 mg PO Qhs for insomnia Patient does not meet criteria for psychiatric inpatient admission. Supportive therapy provided about ongoing stressors.  Appreciate psychiatric consultation and follow up as clinically required Please contact [phone] or [phone] if needs further assistance  Disposition: Refer to out patient psychiatric medication management when medically stable.  Rich Paprocki,JANARDHAHA R. 12/29/2014 10:49 AM

## 2014-12-30 DIAGNOSIS — R7881 Bacteremia: Secondary | ICD-10-CM

## 2014-12-30 DIAGNOSIS — E876 Hypokalemia: Secondary | ICD-10-CM | POA: Diagnosis present

## 2014-12-30 LAB — FOLATE RBC
FOLATE, HEMOLYSATE: 229.9 ng/mL
Folate, RBC: 663 ng/mL (ref >498)
HEMATOCRIT: 34.7 % (ref 34.0–46.6)

## 2014-12-30 LAB — HIV ANTIBODY (ROUTINE TESTING W REFLEX): HIV SCREEN 4TH GENERATION: NONREACTIVE

## 2014-12-30 MED ORDER — TRAZODONE HCL 50 MG PO TABS: 50.0000 mg | ORAL_TABLET | Freq: Every day | ORAL | Status: DC

## 2014-12-30 MED ORDER — POTASSIUM CHLORIDE CRYS ER 20 MEQ PO TBCR
40.0000 meq | EXTENDED_RELEASE_TABLET | Freq: Once | ORAL | Status: AC
  Administered 2014-12-30: 40 meq via ORAL
  Filled 2014-12-30: qty 2

## 2014-12-30 MED ORDER — PHENYTOIN SODIUM EXTENDED 300 MG PO CAPS: 300.0000 mg | ORAL_CAPSULE | Freq: Every day | ORAL | Status: DC

## 2014-12-30 MED ORDER — DULOXETINE HCL 30 MG PO CPEP: 30.0000 mg | ORAL_CAPSULE | Freq: Every day | ORAL | Status: DC

## 2014-12-30 NOTE — Progress Notes (Signed)
Discharge instructions reviewed with patient/family. RX given. All questions answered at this time. Transport by family.  Sim Boast, RN

## 2014-12-30 NOTE — Consult Note (Signed)
Psychiatry Consult follow-up  Reason for Consult:  depression Referring Physician:  Dr. Conley Canal Patient Identification: Makayla Livingston MRN:  [id number] Principal Diagnosis: New onset seizure Diagnosis:   Patient Active Problem List   Diagnosis Date Noted  . Hypokalemia [E87.6] 12/30/2014  . Morbid obesity [E66.01] 12/30/2014  . Bacteremia [R78.81] 12/29/2014  . MDD (major depressive disorder), single episode [F32.9] 12/29/2014  . Acute respiratory failure [J96.00]   . New onset seizure [R56.9]   . Acute encephalopathy [G93.40] 12/24/2014    Total Time spent with patient: 30 minutes  Subjective:   Makayla Livingston is a 19 y.o. female patient admitted with depression and new onset of seizure.  HPI:  Makayla Livingston is a 19 y/o Female, student of [employer] and staying with her mother. Patient reportedly feeling depressed and has passive suicide ideations. She had one episode of witting a letter about being disappointing her mother and took benadryl 8 tabs with intention to sleep and not to wake up. Patient mother was able to talk her out of it. She continue to have depressed and stressed mood, trying to loose weight, disturbed sleep and decreased oral intake. She reportedly eating one sand witch a day and drinking two cups of 16 hours of tea a day. She has new onset of seizure. She has loss of interest in her daily life, not able to care for her room, reportedly hoarding a lot of trash, not even able to care for her personal hygiene. She has been stressed about her weight, relationship, school, family and no job. She is willing to take medication for depression and insomnia. She is willing to obtain professional service to reduce her weight. She wishes to get into her ideal body weight. She has no family history of depression or seizure disorder. She has no history of substance abuse or past psych history.   Interval history: Patient seen today for psychiatric consultation follow-up. Patient has been  compliant with her medication Cymbalta and also trazodone and reportedly has no adverse effects. Patient and her mother stated that she has been posteriorly responding to her medication and hoping that she is willing to focus on her problems and staff need to symptoms of depression. Patient has a Environmental health practitioner in weight reduction program and also outpatient medication management. Patient was provided outpatient resources from psychiatric social service. Patient has no safety concerns. Patient appeared more relaxed and bright affect during this visit.  Past Medical History: History reviewed. No pertinent past medical history. History reviewed. No pertinent past surgical history. Family History: History reviewed. No pertinent family history. Social History:  History  Alcohol Use No     History  Drug Use No    History   Social History  . Marital Status: Single    Spouse Name: N/A  . Number of Children: N/A  . Years of Education: N/A   Social History Main Topics  . Smoking status: Never Smoker   . Smokeless tobacco: Not on file  . Alcohol Use: No  . Drug Use: No  . Sexual Activity: Not on file   Other Topics Concern  . None   Social History Narrative   Additional Social History:                          Allergies:   Allergies  Allergen Reactions  . Penicillins Hives, Nausea And Vomiting and Rash  . Pollen Extract Other (See Comments)    Seasonal allergies  Labs:  Results for orders placed or performed during the hospital encounter of 12/24/14 (from the past 48 hour(s))  Glucose, capillary     Status: None   Collection Time: 12/28/14 12:25 PM  Result Value Ref Range   Glucose-Capillary 84 65 - 99 mg/dL  Glucose, capillary     Status: None   Collection Time: 12/28/14  4:28 PM  Result Value Ref Range   Glucose-Capillary 76 65 - 99 mg/dL  Vancomycin, trough     Status: None   Collection Time: 12/28/14  5:00 PM  Result Value Ref Range   Vancomycin Tr  19 10.0 - 20.0 ug/mL  Vitamin B12     Status: None   Collection Time: 12/29/14  9:15 AM  Result Value Ref Range   Vitamin B-12 583 180 - 914 pg/mL    Comment: (NOTE) This assay is not validated for testing neonatal or myeloproliferative syndrome specimens for Vitamin B12 levels.   Iron and TIBC     Status: None   Collection Time: 12/29/14  9:15 AM  Result Value Ref Range   Iron 75 28 - 170 ug/dL   TIBC 270 250 - 450 ug/dL   Saturation Ratios 28 10.4 - 31.8 %   UIBC 195 ug/dL  Ferritin     Status: None   Collection Time: 12/29/14  9:15 AM  Result Value Ref Range   Ferritin 58 11 - 307 ng/mL  Reticulocytes     Status: None   Collection Time: 12/29/14  9:15 AM  Result Value Ref Range   Retic Ct Pct 1.5 0.4 - 3.1 %   RBC. 4.47 3.87 - 5.11 MIL/uL   Retic Count, Manual 67.1 19.0 - 186.0 K/uL  CBC with Differential/Platelet     Status: Abnormal   Collection Time: 12/29/14 11:30 AM  Result Value Ref Range   WBC 5.5 4.0 - 10.5 K/uL   RBC 4.67 3.87 - 5.11 MIL/uL   Hemoglobin 10.9 (L) 12.0 - 15.0 g/dL   HCT 34.1 (L) 36.0 - 46.0 %   MCV 73.0 (L) 78.0 - 100.0 fL   MCH 23.3 (L) 26.0 - 34.0 pg   MCHC 32.0 30.0 - 36.0 g/dL   RDW 14.4 11.5 - 15.5 %   Platelets 342 150 - 400 K/uL   Neutrophils Relative % 53 43 - 77 %   Lymphocytes Relative 37 12 - 46 %   Monocytes Relative 7 3 - 12 %   Eosinophils Relative 3 0 - 5 %   Basophils Relative 0 0 - 1 %   Neutro Abs 2.9 1.7 - 7.7 K/uL   Lymphs Abs 2.0 0.7 - 4.0 K/uL   Monocytes Absolute 0.4 0.1 - 1.0 K/uL   Eosinophils Absolute 0.2 0.0 - 0.7 K/uL   Basophils Absolute 0.0 0.0 - 0.1 K/uL   WBC Morphology ATYPICAL LYMPHOCYTES   Comprehensive metabolic panel     Status: Abnormal   Collection Time: 12/29/14 11:30 AM  Result Value Ref Range   Sodium 141 135 - 145 mmol/L   Potassium 3.2 (L) 3.5 - 5.1 mmol/L   Chloride 106 101 - 111 mmol/L   CO2 28 22 - 32 mmol/L   Glucose, Bld 95 65 - 99 mg/dL   BUN <5 (L) 6 - 20 mg/dL   Creatinine, Ser  0.72 0.44 - 1.00 mg/dL   Calcium 8.8 (L) 8.9 - 10.3 mg/dL   Total Protein 6.7 6.5 - 8.1 g/dL   Albumin 3.0 (L) 3.5 - 5.0 g/dL  AST 25 15 - 41 U/L   ALT 19 14 - 54 U/L   Alkaline Phosphatase 74 38 - 126 U/L   Total Bilirubin 0.6 0.3 - 1.2 mg/dL   GFR calc non Af Amer >60 >60 mL/min   GFR calc Af Amer >60 >60 mL/min    Comment: (NOTE) The eGFR has been calculated using the CKD EPI equation. This calculation has not been validated in all clinical situations. eGFR's persistently <60 mL/min signify possible Chronic Kidney Disease.    Anion gap 7 5 - 15  Ammonia     Status: None   Collection Time: 12/29/14 11:30 AM  Result Value Ref Range   Ammonia 16 9 - 35 umol/L  HIV antibody     Status: None   Collection Time: 12/29/14 11:30 AM  Result Value Ref Range   HIV Screen 4th Generation wRfx Non Reactive Non Reactive    Comment: (NOTE) Performed At: [facility] [place], [place] [id number] Lindon Romp MD JJ:[id number]   TSH     Status: None   Collection Time: 12/29/14 11:30 AM  Result Value Ref Range   TSH 1.620 0.350 - 4.500 uIU/mL  Magnesium     Status: Abnormal   Collection Time: 12/29/14 11:30 AM  Result Value Ref Range   Magnesium 1.6 (L) 1.7 - 2.4 mg/dL  Culture, blood (routine x 2)     Status: None (Preliminary result)   Collection Time: 12/29/14  4:55 PM  Result Value Ref Range   Specimen Description BLOOD RIGHT PICC LINE    Special Requests BOTTLES DRAWN AEROBIC AND ANAEROBIC 10CC    Culture             BLOOD CULTURE RECEIVED NO GROWTH TO DATE CULTURE WILL BE HELD FOR 5 DAYS BEFORE ISSUING A FINAL NEGATIVE REPORT Performed at [facility]    Report Status PENDING   Culture, blood (routine x 2)     Status: None (Preliminary result)   Collection Time: 12/29/14  5:00 PM  Result Value Ref Range   Specimen Description BLOOD RIGHT PICC LINE    Special Requests BOTTLES DRAWN AEROBIC AND ANAEROBIC 10CC    Culture             BLOOD  CULTURE RECEIVED NO GROWTH TO DATE CULTURE WILL BE HELD FOR 5 DAYS BEFORE ISSUING A FINAL NEGATIVE REPORT Performed at [facility]    Report Status PENDING     Vitals: Blood pressure 110/75, pulse 95, temperature 98.4 F (36.9 C), temperature source Oral, resp. rate 17, height _0  (1.702 m), weight 164.883 kg (363 lb 8 oz), last menstrual period 12/22/2014, SpO2 97 %.  Risk to Self: Is patient at risk for suicide?: No Risk to Others:   Prior Inpatient Therapy:   Prior Outpatient Therapy:    Current Facility-Administered Medications  Medication Dose Route Frequency Provider Last Rate Last Dose  . 0.9 %  sodium chloride infusion   Intravenous Continuous Colbert Coyer, MD 10 mL/hr at 12/27/14 1800    . acetaminophen (TYLENOL) tablet 650 mg  650 mg Oral Q6H PRN Cherene Altes, MD      . albuterol (PROVENTIL) (2.5 MG/3ML) 0.083% nebulizer solution 2.5 mg  2.5 mg Nebulization Q4H PRN Colbert Coyer, MD   2.5 mg at 12/29/14 0430  . benzonatate (TESSALON) capsule 100 mg  100 mg Oral TID PRN Delfina Redwood, MD   100 mg at 12/29/14 1100  . DULoxetine (  CYMBALTA) DR capsule 30 mg  30 mg Oral Daily Ambrose Finland, MD   30 mg at 12/30/14 1022  . enoxaparin (LOVENOX) injection 80 mg  80 mg Subcutaneous Q24H Kara Mead V, MD   80 mg at 12/29/14 0954  . guaifenesin (ROBITUSSIN) 100 MG/5ML syrup 200 mg  200 mg Oral Q4H PRN Delfina Redwood, MD      . LORazepam (ATIVAN) injection 2 mg  2 mg Intravenous PRN Donita Brooks, NP      . LORazepam (ATIVAN) tablet 0.5 mg  0.5 mg Oral Q4H PRN Cherene Altes, MD   0.5 mg at 12/28/14 2203  . magic mouthwash w/lidocaine  5 mL Oral TID PRN Cherene Altes, MD      . phenytoin (DILANTIN) ER capsule 300 mg  300 mg Oral QHS Greta Doom, MD   300 mg at 12/29/14 2251  . sodium chloride 0.9 % injection 10-40 mL  10-40 mL Intracatheter PRN Cherene Altes, MD      . sodium chloride 0.9 % injection 10-40 mL  10-40  mL Intracatheter PRN Delfina Redwood, MD   20 mL at 12/29/14 1152  . traZODone (DESYREL) tablet 50 mg  50 mg Oral QHS Ambrose Finland, MD   50 mg at 12/29/14 2251    Musculoskeletal: Strength & Muscle Tone: decreased Gait & Station: unable to stand Patient leans: N/A  Psychiatric Specialty Exam: Physical Exam as per history and physical  ROS   Blood pressure 110/75, pulse 95, temperature 98.4 F (36.9 C), temperature source Oral, resp. rate 17, height _0  (1.702 m), weight 164.883 kg (363 lb 8 oz), last menstrual period 12/22/2014, SpO2 97 %.Body mass index is 56.92 kg/(m^2).  General Appearance: Guarded  Eye Contact::  Good  Speech:  Clear and Coherent  Volume:  Decreased  Mood:  Depressed  Affect:  Appropriate and Congruent  Thought Process:  Coherent and Goal Directed  Orientation:  Full (Time, Place, and Person)  Thought Content:  Rumination  Suicidal Thoughts:  No  Homicidal Thoughts:  No  Memory:  Immediate;   Good Recent;   Good  Judgement:  Intact  Insight:  Fair  Psychomotor Activity:  Decreased  Concentration:  Fair  Recall:  Good  Fund of Knowledge:Good  Language: Good  Akathisia:  Negative  Handed:  Right  AIMS (if indicated):     Assets:  Communication Skills Desire for Improvement Financial Resources/Insurance Housing Intimacy Leisure Time Resilience Social Support Talents/Skills Transportation Vocational/Educational  ADL's:  Impaired  Cognition: WNL  Sleep:      Medical Decision Making: New problem, with additional work up planned, Review of Psycho-Social Stressors (1), Review or order clinical lab tests (1), Review of Last Therapy Session (1), Review or order medicine tests (1), Review of Medication Regimen & Side Effects (2) and Review of New Medication or Change in Dosage (2)  Treatment Plan Summary: Patient has been compliant with her medication management and possibly responded without significant adverse effects. She denied  current suicide/homicide ideation, intention or plans. Patient contract for safety and patient mom has been supportive to her.  Daily contact with patient to assess and evaluate symptoms and progress in treatment and Medication management  Plan: Patient has no safety concerns during this evaluation. Patient is motivated of completing education, finding a job and loosing weight etc.  Continue Cymbalta 30 mg PO Qam for depression and Trazodone 50 mg PO Qhs for insomnia Patient does not meet criteria for psychiatric  inpatient admission. Supportive therapy provided about ongoing stressors.  Appreciate psychiatric consultation and follow up as clinically required Please contact [phone] or [phone] if needs further assistance  Disposition: Refer to out patient psychiatric medication management when medically stable.  Juanjesus Pepperman,JANARDHAHA R. 12/30/2014 12:11 PM

## 2014-12-30 NOTE — Clinical Social Work Psych Note (Signed)
Psych CSW met with patient at bedside to review outpatient psychiatric follow-up at time of discharge.  Patient was referred to [facility] for medication management- payer source Medicaid and resident of [place].  Patient is agreeable to follow-up.  Nonnie Done, LCSW [phone]  Psychiatric & Orthopedics (5N 1-8) Clinical Social Worker

## 2014-12-30 NOTE — Progress Notes (Signed)
To Whom It May Concern:   Makayla Livingston is unable to complete her summer semester at school due to health condition, but may return in the fall semester.   Sincerely,   Crista Curb, MD [facility]

## 2014-12-30 NOTE — Care Management Note (Signed)
Case Management Note  Patient Details  Name: Makayla Livingston MRN: [id number] Date of Birth: [birth year]  Subjective/Objective:                    Action/Plan: Met with patient and mother at bedside to further discuss discharge planning.  CM has made a follow-up appointment at the [facility] for 01/08/15 at 0900.  This information was provided to patient and added into the AVS.  Patient was also provided with a [place] letter.  Mother has verbalized understanding of the [facility] program and is aware of the expiration date.  Patient and mother deny any additional case management needs.  Bedside RN updated.  Expected Discharge Date:                  Expected Discharge Plan:  Home/Self Care  In-House Referral:  Financial Counselor  Discharge planning Services  CM Consult, [facility], [facility] Program  Post Acute Care Choice:    Choice offered to:     DME Arranged:    DME Agency:     HH Arranged:    [place] Agency:     Status of Service:  Completed, signed off  Medicare Important Message Given:    Date Medicare IM Given:    Medicare IM give by:    Date Additional Medicare IM Given:    Additional Medicare Important Message give by:     If discussed at [place] of Stay Meetings, dates discussed:    Additional Comments:  Rolm Baptise, RN 12/30/2014, 11:19 AM

## 2014-12-30 NOTE — Progress Notes (Signed)
Physical Therapy Treatment Patient Details Name: Makayla Livingston MRN: [id number] DOB: April 17, 1996 Today's Date: 12/30/2014    History of Present Illness 19 y/o F with no known PMH who presented to [facility] ER on 6/8 with AMS. Developed seizures in ER and subsequently intubated. PMH: morbidly obese. Head CT with no acute abnormality.    PT Comments    Pt con't to have decreased insight to her deficits and safety awareness. Pt was able to ambulate and tolerate 2 flights of stairs. Pt unsteady and educated on energy conservation and to make sure she only ambulates when someone is with her.   Follow Up Recommendations  Home health PT;Supervision/Assistance - 24 hour     Equipment Recommendations  None recommended by PT    Recommendations for Other Services       Precautions / Restrictions Precautions Precautions: Fall Restrictions Weight Bearing Restrictions: No    Mobility  Bed Mobility Overal bed mobility: Modified Independent             General bed mobility comments: use of bed rail  Transfers Overall transfer level: Needs assistance Equipment used: 1 person hand held assist Transfers: Sit to/from Stand Sit to Stand: Min guard         General transfer comment: guard due to lateral sway and mild instability  Ambulation/Gait Ambulation/Gait assistance: Min guard Ambulation Distance (Feet): 120 Feet Assistive device:  (min guard on gait belt) Gait Pattern/deviations: Step-through pattern;Decreased stride length;Wide base of support Gait velocity: slow   General Gait Details: pt staggering L/R and running into things on the Right. no true episode of LOB trust instability   Stairs Stairs: Yes Stairs assistance: Min guard Stair Management: One rail Left Number of Stairs: 24 (2 flights) General stair comments: c/o "it's hard to breathe" c/o cough from vent tube. pt able to complete alternating step pattern. stood and rested at top of 2 flights prior to  descending  Wheelchair Mobility    Modified Rankin (Stroke Patients Only)       Balance     Sitting balance-Leahy Scale: Good       Standing balance-Leahy Scale: Fair Standing balance comment: pt able to stand at sink and wash hands without difficulty                    Cognition Arousal/Alertness: Awake/alert Behavior During Therapy: Flat affect Overall Cognitive Status: Impaired/Different from baseline Area of Impairment: Problem solving;Safety/judgement         Safety/Judgement: Decreased awareness of safety;Decreased awareness of deficits   Problem Solving: Slow processing      Exercises      General Comments        Pertinent Vitals/Pain Pain Assessment: No/denies pain    Home Living                      Prior Function            PT Goals (current goals can now be found in the care plan section) Acute Rehab PT Goals Patient Stated Goal: go home Progress towards PT goals: Progressing toward goals    Frequency  Min 3X/week    PT Plan Current plan remains appropriate    Co-evaluation             End of Session Equipment Utilized During Treatment: Gait belt Activity Tolerance: Patient limited by fatigue Patient left: in chair;with call bell/phone within reach;with chair alarm set     Time: [phone] PT  Time Calculation (min) (ACUTE ONLY): 20 min  Charges:  $Gait Training: 8-22 mins                    G Codes:      Marcene Brawn 12/30/2014, 10:24 AM  Lewis Shock, PT, DPT Pager #: [phone] Office #: [phone]

## 2014-12-30 NOTE — Discharge Summary (Signed)
Physician Discharge Summary  Keyon Winnick [facility] NGE:[id number] DOB: [birth year] DOA: 12/24/2014  PCP: Lyda Perone, MD  Admit date: 12/24/2014 Discharge date: 12/30/2014  Time spent: greater than 30 minutes  Recommendations for Outpatient Follow-up:  1. No driving x6 months 2. Needs outpatient open MRI. Would not fit in traditional MRI  Discharge Diagnoses:  Principal Problem:   MDD (major depressive disorder), single episode Active Problems:   Acute encephalopathy   New onset seizure   Acute respiratory failure   Bacteremia, contaminant depression  Discharge Condition: stable  Diet recommendation: general  Filed Weights   12/27/14 0425 12/29/14 0438 12/30/14 0607  Weight: 181.439 kg (400 lb) 166.969 kg (368 lb 1.6 oz) 164.883 kg (363 lb 8 oz)    History of present illness/Hospital Course:  19 y/o F with no known PMH who on 6/7 presented to ER with complaints of sores on her tongue that began 6/6 with associated mouth pain. She was treated with carafate and discharged. At baseline, she lives with her mother and is a Consulting civil engineer at [employer] She is studying biochemistry and psychology. Mother denies known drug use. The patient has one brother who is healthy. No known bug bites or wounds. Non-smoker. After she was was seen in ER on 6/7 she went home and was in her usual state of health. The mother reports the am of 6/8 she woke to a loud noise and found her daughter on the floor with a bookshelf on top of her. She was altered and unable to re-orient her. EMS was activated and was witnessed to have 2 seizures.   In the ER she had 4 more generalized seizures. She was treated with IV ativan and haldol for agitation. She continued to have seizures and decision was made to intubate for airway protection. She had a total of 7 seizures between EMS and in the ER. The patient had significant agitation requiring 4 point restraints. Placed on propofol and CT head was negative. admitted to ICU  PCCM.  New onset Seizures Neurology consulted.  Initially started on Keppra. No further seizures. EEG without epileptiform activity.  Patient experienced agitation and altered behavior, felt to be related to Keppra. Changed to vimpat, then dilantin due to cost.  Pt is uninsured. No driving x 6 months.  Needs elective open MRI  Acute agitation: likely a reaction to Keppra. Patient's mother is requesting a psychiatry consult. Ipatient does endorse feelings of depression. Psychiatry consulted and recommend cymbalta and trazodone  Herpangina seen in ER 6/7   Coag-negative staph bacteremia Likely contaminant. Repeat cx neg  Hypokalemia corrected  Acute Kidney Injury Resolved with hydration  Procedures:  Intubation, mechanical ventillation  Consultations:  PCCM  Neurology  psychiatry  Discharge Exam: Filed Vitals:   12/30/14 1001  BP: 110/75  Pulse: 95  Temp: 98.4 F (36.9 C)  Resp: 17    General: a and o Cardiovascular: RRR Respiratory: CTA  Discharge Instructions   Discharge Instructions    Diet general    Complete by:  As directed      Increase activity slowly    Complete by:  As directed           Current Discharge Medication List    START taking these medications   Details  DULoxetine (CYMBALTA) 30 MG capsule Take 1 capsule (30 mg total) by mouth daily. Qty: 30 capsule, Refills: 1    phenytoin (DILANTIN) 300 MG ER capsule Take 1 capsule (300 mg total) by mouth at bedtime. Qty: 30 capsule,  Refills: 1    traZODone (DESYREL) 50 MG tablet Take 1 tablet (50 mg total) by mouth at bedtime. Qty: 30 tablet, Refills: 1      CONTINUE these medications which have NOT CHANGED   Details  naproxen (NAPROSYN) 500 MG tablet Take 500 mg by mouth daily.    sucralfate (CARAFATE) 1 GM/10ML suspension Take 5 mLs (0.5 g total) by mouth 4 (four) times daily -  with meals and at bedtime. Qty: 420 mL, Refills: 0      STOP taking these medications     ibuprofen  (ADVIL,MOTRIN) 600 MG tablet        Allergies  Allergen Reactions  . Penicillins Hives, Nausea And Vomiting and Rash  . Pollen Extract Other (See Comments)    Seasonal allergies   Follow-up Information    Follow up with [facility] On 01/08/2015.   Why:  9:00am.  Bring all discharge papers and a photo ID to appointment.    Contact information:   [address] [place] [address] [phone]      Follow up with [facility].   Why:  to follow up seizures   Contact information:   [address] [place] [address] [phone]       The results of significant diagnostics from this hospitalization (including imaging, microbiology, ancillary and laboratory) are listed below for reference.    Significant Diagnostic Studies: Ct Head Wo Contrast  12/24/2014   CLINICAL DATA:  Seizure. Initial encounter. Patient intubated following seizure.  EXAM: CT HEAD WITHOUT CONTRAST  TECHNIQUE: Contiguous axial images were obtained from the base of the skull through the vertex without intravenous contrast.  COMPARISON:  None.  FINDINGS: Nonstandard scan plane was used because of obese body habitus and intubation. Endotracheal tube and probable orogastric tube present in the oropharynx. Debris is present in the posterior pharynx. Piercing is present over the RIGHT mandible.  No mass lesion, mass effect, midline shift, hydrocephalus, hemorrhage. No territorial ischemia or acute infarction. The calvarium is intact. Frontal sinuses and ethmoid air cells appear within normal limits. Over the anterior frontal lobes, there is slightly higher attenuation is most compatible with beam hardening artifact. This artifact is accentuated by the imaging plane.  IMPRESSION: 1. No acute intracranial abnormality. 2. Nonstandard imaging plane secondary to body habitus.   Electronically Signed   By: Andreas Newport M.D.   On:  12/24/2014 09:10   Dg Chest Port 1 View  12/27/2014   CLINICAL DATA:  Respiratory failure  EXAM: PORTABLE CHEST - 1 VIEW  COMPARISON:  12/25/2014  FINDINGS: The endotracheal tube and nasogastric tube have been removed. The left jugular central line is withdrawn from its former position. It now does not reach the SVC but it still is within the central circulation, probably reaching the brachiocephalic vein. Mild left base opacity persists. There is no pneumothorax.  IMPRESSION: Left jugular central line is withdrawn from its former position, no longer extending into the SVC but likely reaching the left brachiocephalic vein.  Left base atelectasis or infiltrate.   Electronically Signed   By: Ellery Plunk M.D.   On: 12/27/2014 06:47   Dg Chest Port 1 View  12/25/2014   CLINICAL DATA:  Central line placement  EXAM: PORTABLE CHEST - 1 VIEW  COMPARISON:  12/24/2014 at 15:39  FINDINGS: The endotracheal tube tip is 4.1 cm above the carina. There is a new left jugular central line extending into  the low SVC. Nasogastric tube extends into the stomach. There is no pneumothorax. There is no confluent airspace consolidation. There is no large effusion.  IMPRESSION: New left jugular central line extends into the low SVC. No pneumothorax.   Electronically Signed   By: Ellery Plunk M.D.   On: 12/25/2014 02:10   Dg Chest Port 1 View  12/24/2014   CLINICAL DATA:  Acute respiratory failure. Seizures. Altered mental status.  EXAM: PORTABLE CHEST - 1 VIEW  COMPARISON:  12/24/2014 at 0755 hours.  FINDINGS: Support apparatus: Endotracheal tube tip 3.7 cm from the carina. The tip is at the level of the clavicles. Enteric tube is present with the tip near the cardia of the stomach.  Cardiomediastinal Silhouette:  Unchanged allowing for projection.  Lungs: Increasing opacification in the RIGHT upper lobe. No pneumothorax.  Effusions:  None.  Other:  None.  IMPRESSION: 1. Retraction of endotracheal tube, now 3.7 cm from the  carina. 2. Lower lung volumes with opacity in the RIGHT upper lobe, probably representing atelectasis although airspace disease could also have this appearance.   Electronically Signed   By: Andreas Newport M.D.   On: 12/24/2014 15:51   Dg Chest Portable 1 View  12/24/2014   CLINICAL DATA:  19 year old female status post seizure, intubated. Initial encounter.  EXAM: PORTABLE CHEST - 1 VIEW  COMPARISON:  11/11/2007.  FINDINGS: Portable AP semi upright view at 0755 hours. Large body habitus. The tip of the endotracheal tube is just inside the right mainstem bronchus. Enteric tube also in place, loops in the left upper quadrant below the diaphragm.  Lower lung volumes with left greater than right perihilar opacity, air bronchograms on the left. No pneumothorax. Scoliosis. No acute osseous abnormality identified.  IMPRESSION: 1. Right mainstem bronchus intubation. Retract the ET tube 2 cm for more optimal placement. 2. Enteric tube loops at the level of the gastric fundus. 3. Left greater than right pulmonary opacity might reflect a combination of atelectasis and aspiration in this setting. Study discussed by telephone with Dr. Mirian Mo on 12/24/2014 at 08:06 .   Electronically Signed   By: Odessa Fleming M.D.   On: 12/24/2014 08:06   Dg Fluoro Guide Lumbar Puncture  12/24/2014   CLINICAL DATA:  19 year old female with seizure, recent diagnosis of Herpangina. Unsuccessful bedside lumbar puncture. Initial encounter.  EXAM: DIAGNOSTIC LUMBAR PUNCTURE UNDER FLUOROSCOPIC GUIDANCE  FLUOROSCOPY TIME:  Radiation Exposure Index (as provided by the fluoroscopic device):  If the device does not provide the exposure index:  Fluoroscopy Time (in minutes and seconds):  0 minutes 30 seconds  Number of Acquired Images:  2  PROCEDURE: Informed consent was obtained via the telephone from the patient's mother. A "time-out" was performed.  The patient was already intubated. She was placed prone, the lower back was prepped with Betadine.   1% Lidocaine was used for local anesthesia. Lumbar puncture was performed at the L2-L3 level using a 5 inch 20 knee gauge needle with return of clear, colorless CSF with an opening pressure of 15 cm water. 14 mL of CSF were obtained for laboratory studies.  The patient tolerated the procedure well and there were no apparent complications.  IMPRESSION: Fluoroscopic guided lumbar puncture. Clear CSF and normal opening pressure. 14 mL of CSF collected for lab analysis.   Electronically Signed   By: Odessa Fleming M.D.   On: 12/24/2014 15:52    Microbiology: Recent Results (from the past 240 hour(s))  MRSA PCR Screening  Status: None   Collection Time: 12/24/14 11:28 AM  Result Value Ref Range Status   MRSA by PCR NEGATIVE NEGATIVE Final    Comment:        The GeneXpert MRSA Assay (FDA approved for NASAL specimens only), is one component of a comprehensive MRSA colonization surveillance program. It is not intended to diagnose MRSA infection nor to guide or monitor treatment for MRSA infections.   Urine culture     Status: None   Collection Time: 12/24/14 11:31 AM  Result Value Ref Range Status   Specimen Description URINE, CATHETERIZED  Final   Special Requests NONE  Final   Colony Count NO GROWTH Performed at [facility]   Final   Culture NO GROWTH Performed at [facility]   Final   Report Status 12/25/2014 FINAL  Final  Gram stain     Status: None   Collection Time: 12/24/14  3:27 PM  Result Value Ref Range Status   Specimen Description CSF  Final   Special Requests NONE  Final   Gram Stain   Final    WBC PRESENT, PREDOMINANTLY MONONUCLEAR NO ORGANISMS SEEN CYTOSPIN SLIDE    Report Status 12/24/2014 FINAL  Final  CSF culture     Status: None   Collection Time: 12/24/14  3:27 PM  Result Value Ref Range Status   Specimen Description CSF  Final   Special Requests NONE  Final   Gram Stain   Final    WBC PRESENT, PREDOMINANTLY MONONUCLEAR NO ORGANISMS  SEEN CYTOSPIN Performed at [facility] Performed at [facility]    Culture   Final    NO GROWTH 3 DAYS Performed at [facility]    Report Status 12/28/2014 FINAL  Final  Culture, respiratory (tracheal aspirate)     Status: None   Collection Time: 12/24/14  4:17 PM  Result Value Ref Range Status   Specimen Description TRACHEAL ASPIRATE  Final   Special Requests NONE  Final   Gram Stain   Final    FEW WBC PRESENT,BOTH PMN AND MONONUCLEAR RARE SQUAMOUS EPITHELIAL CELLS PRESENT FEW GRAM POSITIVE COCCI IN PAIRS IN CHAINS IN CLUSTERS RARE GRAM NEGATIVE COCCI Performed at [facility]    Culture   Final    FEW STAPHYLOCOCCUS AUREUS Note: RIFAMPIN AND GENTAMICIN SHOULD NOT BE USED AS SINGLE DRUGS FOR TREATMENT OF STAPH INFECTIONS. Performed at [facility]    Report Status 12/27/2014 FINAL  Final   Organism ID, Bacteria STAPHYLOCOCCUS AUREUS  Final      Susceptibility   Staphylococcus aureus - MIC*    CLINDAMYCIN <=0.25 SENSITIVE Sensitive     ERYTHROMYCIN <=0.25 SENSITIVE Sensitive     GENTAMICIN <=0.5 SENSITIVE Sensitive     LEVOFLOXACIN <=0.12 SENSITIVE Sensitive     OXACILLIN 0.5 SENSITIVE Sensitive     PENICILLIN 0.06 SENSITIVE Sensitive     RIFAMPIN <=0.5 SENSITIVE Sensitive     TRIMETH/SULFA <=10 SENSITIVE Sensitive     VANCOMYCIN 1 SENSITIVE Sensitive     TETRACYCLINE <=1 SENSITIVE Sensitive     MOXIFLOXACIN <=0.25 SENSITIVE Sensitive     * FEW STAPHYLOCOCCUS AUREUS  Culture, blood (routine x 2)     Status: None   Collection Time: 12/25/14 12:10 AM  Result Value Ref Range Status   Specimen Description BLOOD LEFT NECK  Final   Special Requests BOTTLES DRAWN AEROBIC AND ANAEROBIC 10CC  Final   Culture   Final    STAPHYLOCOCCUS SPECIES (COAGULASE  NEGATIVE) Note: THE SIGNIFICANCE OF ISOLATING THIS ORGANISM FROM A SINGLE VENIPUNCTURE CANNOT BE PREDICTED WITHOUT FURTHER CLINICAL AND CULTURE CORRELATION. SUSCEPTIBILITIES AVAILABLE  ONLY ON REQUEST. Note: Gram Stain Report Called to,Read Back By and Verified With: LAURA CAUDLE [id number]@405AM  BJENN Performed at [facility]    Report Status 12/27/2014 FINAL  Final  Culture, blood (routine x 2)     Status: None (Preliminary result)   Collection Time: 12/29/14  4:55 PM  Result Value Ref Range Status   Specimen Description BLOOD RIGHT PICC LINE  Final   Special Requests BOTTLES DRAWN AEROBIC AND ANAEROBIC 10CC  Final   Culture   Final           BLOOD CULTURE RECEIVED NO GROWTH TO DATE CULTURE WILL BE HELD FOR 5 DAYS BEFORE ISSUING A FINAL NEGATIVE REPORT Performed at [facility]    Report Status PENDING  Incomplete  Culture, blood (routine x 2)     Status: None (Preliminary result)   Collection Time: 12/29/14  5:00 PM  Result Value Ref Range Status   Specimen Description BLOOD RIGHT PICC LINE  Final   Special Requests BOTTLES DRAWN AEROBIC AND ANAEROBIC 10CC  Final   Culture   Final           BLOOD CULTURE RECEIVED NO GROWTH TO DATE CULTURE WILL BE HELD FOR 5 DAYS BEFORE ISSUING A FINAL NEGATIVE REPORT Performed at [facility]    Report Status PENDING  Incomplete     Labs: Basic Metabolic Panel:  Recent Labs Lab 12/25/14 0253 12/26/14 0355 12/27/14 0410 12/28/14 0415 12/29/14 1130  NA 138 140 142 141 141  K 3.3* 3.3* 3.2* 3.3* 3.2*  CL 111 108 111 106 106  CO2 21* 24 24 26 28   GLUCOSE 101* 106* 117* 97 95  BUN <5* <5* <5* <5* <5*  CREATININE 0.73 0.80 0.73 0.70 0.72  CALCIUM 8.1* 8.2* 8.2* 8.5* 8.8*  MG 2.0  --   --   --  1.6*  PHOS 3.4  --   --   --   --    Liver Function Tests:  Recent Labs Lab 12/24/14 0610 12/25/14 0253 12/29/14 1130  AST 38 23 25  ALT 28 22 19   ALKPHOS 91 75 74  BILITOT 0.3 0.4 0.6  PROT 7.9 5.7* 6.7  ALBUMIN 3.5 2.8* 3.0*   No results for input(s): LIPASE, AMYLASE in the last 168 hours.  Recent Labs Lab 12/29/14 1130  AMMONIA 16   CBC:  Recent Labs Lab 12/24/14 0610 12/25/14 0253  12/26/14 0355 12/27/14 0410 12/28/14 0415 12/29/14 1130  WBC 12.5* 7.9 7.8 8.5 6.5 5.5  NEUTROABS 8.7*  --   --   --   --  2.9  HGB 11.9* 9.9* 10.0* 9.8* 10.1* 10.9*  HCT 38.1 32.0* 31.9* 30.9* 31.3* 34.1*  MCV 75.3* 74.1* 74.2* 74.8* 73.8* 73.0*  PLT 452* 315 322 332 321 342   Cardiac Enzymes: No results for input(s): CKTOTAL, CKMB, CKMBINDEX, TROPONINI in the last 168 hours. BNP: BNP (last 3 results) No results for input(s): BNP in the last 8760 hours.  ProBNP (last 3 results) No results for input(s): PROBNP in the last 8760 hours.  CBG:  Recent Labs Lab 12/28/14 0346 12/28/14 0426 12/28/14 0823 12/28/14 1225 12/28/14 1628  GLUCAP 67 90 102* 84 76       Signed:  Sigourney Portillo L  [facility] 12/30/2014, 10:49 AM

## 2014-12-31 ENCOUNTER — Ambulatory Visit: Payer: Managed Care, Other (non HMO) | Attending: Internal Medicine

## 2015-01-04 LAB — CULTURE, BLOOD (ROUTINE X 2)
Culture: NO GROWTH
Culture: NO GROWTH

## 2015-01-08 ENCOUNTER — Encounter: Payer: Self-pay | Admitting: Internal Medicine

## 2015-01-08 ENCOUNTER — Ambulatory Visit: Payer: Medicaid Other | Attending: Internal Medicine | Admitting: Internal Medicine

## 2015-01-08 VITALS — BP 142/85 | HR 106 | Temp 98.0°F | Resp 16 | Wt 355.8 lb

## 2015-01-08 DIAGNOSIS — IMO0001 Reserved for inherently not codable concepts without codable children: Secondary | ICD-10-CM

## 2015-01-08 DIAGNOSIS — F329 Major depressive disorder, single episode, unspecified: Secondary | ICD-10-CM

## 2015-01-08 DIAGNOSIS — E669 Obesity, unspecified: Secondary | ICD-10-CM | POA: Diagnosis not present

## 2015-01-08 DIAGNOSIS — Z791 Long term (current) use of non-steroidal anti-inflammatories (NSAID): Secondary | ICD-10-CM | POA: Diagnosis not present

## 2015-01-08 DIAGNOSIS — Z139 Encounter for screening, unspecified: Secondary | ICD-10-CM

## 2015-01-08 DIAGNOSIS — E876 Hypokalemia: Secondary | ICD-10-CM | POA: Diagnosis not present

## 2015-01-08 DIAGNOSIS — R569 Unspecified convulsions: Secondary | ICD-10-CM

## 2015-01-08 DIAGNOSIS — R030 Elevated blood-pressure reading, without diagnosis of hypertension: Secondary | ICD-10-CM | POA: Diagnosis not present

## 2015-01-08 DIAGNOSIS — F32A Depression, unspecified: Secondary | ICD-10-CM

## 2015-01-08 LAB — COMPLETE METABOLIC PANEL WITH GFR
ALT: 19 U/L (ref 0–35)
AST: 18 U/L (ref 0–37)
Albumin: 3.9 g/dL (ref 3.5–5.2)
Alkaline Phosphatase: 109 U/L (ref 39–117)
BUN: 6 mg/dL (ref 6–23)
CALCIUM: 9.4 mg/dL (ref 8.4–10.5)
CO2: 27 meq/L (ref 19–32)
Chloride: 102 mEq/L (ref 96–112)
Creat: 0.78 mg/dL (ref 0.50–1.10)
Glucose, Bld: 93 mg/dL (ref 70–99)
Potassium: 4 mEq/L (ref 3.5–5.3)
Sodium: 138 mEq/L (ref 135–145)
TOTAL PROTEIN: 7.4 g/dL (ref 6.0–8.3)
Total Bilirubin: 0.3 mg/dL (ref 0.2–1.1)

## 2015-01-08 LAB — HEMOGLOBIN A1C
Hgb A1c MFr Bld: 5.6 % (ref <5.7)
Mean Plasma Glucose: 114 mg/dL (ref <117)

## 2015-01-08 NOTE — Patient Instructions (Signed)
DASH Eating Plan °DASH stands for "Dietary Approaches to Stop Hypertension." The DASH eating plan is a healthy eating plan that has been shown to reduce high blood pressure (hypertension). Additional health benefits may include reducing the risk of type 2 diabetes mellitus, heart disease, and stroke. The DASH eating plan may also help with weight loss. °WHAT DO I NEED TO KNOW ABOUT THE DASH EATING PLAN? °For the DASH eating plan, you will follow these general guidelines: °· Choose foods with a percent daily value for sodium of less than 5% (as listed on the food label). °· Use salt-free seasonings or herbs instead of table salt or sea salt. °· Check with your health care provider or pharmacist before using salt substitutes. °· Eat lower-sodium products, often labeled as "lower sodium" or "no salt added." °· Eat fresh foods. °· Eat more vegetables, fruits, and low-fat dairy products. °· Choose whole grains. Look for the word "whole" as the first word in the ingredient list. °· Choose fish and skinless chicken or turkey more often than red meat. Limit fish, poultry, and meat to 6 oz (170 g) each day. °· Limit sweets, desserts, sugars, and sugary drinks. °· Choose heart-healthy fats. °· Limit cheese to 1 oz (28 g) per day. °· Eat more home-cooked food and less restaurant, buffet, and fast food. °· Limit fried foods. °· Cook foods using methods other than frying. °· Limit canned vegetables. If you do use them, rinse them well to decrease the sodium. °· When eating at a restaurant, ask that your food be prepared with less salt, or no salt if possible. °WHAT FOODS CAN I EAT? °Seek help from a dietitian for individual calorie needs. °Grains °Whole grain or whole wheat bread. Brown rice. Whole grain or whole wheat pasta. Quinoa, bulgur, and whole grain cereals. Low-sodium cereals. Corn or whole wheat flour tortillas. Whole grain cornbread. Whole grain crackers. Low-sodium crackers. °Vegetables °Fresh or frozen vegetables  (raw, steamed, roasted, or grilled). Low-sodium or reduced-sodium tomato and vegetable juices. Low-sodium or reduced-sodium tomato sauce and paste. Low-sodium or reduced-sodium canned vegetables.  °Fruits °All fresh, canned (in natural juice), or frozen fruits. °Meat and Other Protein Products °Ground beef (85% or leaner), grass-fed beef, or beef trimmed of fat. Skinless chicken or turkey. Ground chicken or turkey. Pork trimmed of fat. All fish and seafood. Eggs. Dried beans, peas, or lentils. Unsalted nuts and seeds. Unsalted canned beans. °Dairy °Low-fat dairy products, such as skim or 1% milk, 2% or reduced-fat cheeses, low-fat ricotta or cottage cheese, or plain low-fat yogurt. Low-sodium or reduced-sodium cheeses. °Fats and Oils °Tub margarines without trans fats. Light or reduced-fat mayonnaise and salad dressings (reduced sodium). Avocado. Safflower, olive, or canola oils. Natural peanut or almond butter. °Other °Unsalted popcorn and pretzels. °The items listed above may not be a complete list of recommended foods or beverages. Contact your dietitian for more options. °WHAT FOODS ARE NOT RECOMMENDED? °Grains °White bread. White pasta. White rice. Refined cornbread. Bagels and croissants. Crackers that contain trans fat. °Vegetables °Creamed or fried vegetables. Vegetables in a cheese sauce. Regular canned vegetables. Regular canned tomato sauce and paste. Regular tomato and vegetable juices. °Fruits °Dried fruits. Canned fruit in light or heavy syrup. Fruit juice. °Meat and Other Protein Products °Fatty cuts of meat. Ribs, chicken wings, bacon, sausage, bologna, salami, chitterlings, fatback, hot dogs, bratwurst, and packaged luncheon meats. Salted nuts and seeds. Canned beans with salt. °Dairy °Whole or 2% milk, cream, half-and-half, and cream cheese. Whole-fat or sweetened yogurt. Full-fat   cheeses or blue cheese. Nondairy creamers and whipped toppings. Processed cheese, cheese spreads, or cheese  curds. °Condiments °Onion and garlic salt, seasoned salt, table salt, and sea salt. Canned and packaged gravies. Worcestershire sauce. Tartar sauce. Barbecue sauce. Teriyaki sauce. Soy sauce, including reduced sodium. Steak sauce. Fish sauce. Oyster sauce. Cocktail sauce. Horseradish. Ketchup and mustard. Meat flavorings and tenderizers. Bouillon cubes. Hot sauce. Tabasco sauce. Marinades. Taco seasonings. Relishes. °Fats and Oils °Butter, stick margarine, lard, shortening, ghee, and bacon fat. Coconut, palm kernel, or palm oils. Regular salad dressings. °Other °Pickles and olives. Salted popcorn and pretzels. °The items listed above may not be a complete list of foods and beverages to avoid. Contact your dietitian for more information. °WHERE CAN I FIND MORE INFORMATION? °National Heart, [employer], and [employer] Institute: www.nhlbi.nih.gov/health/health-topics/topics/dash/ °Document Released: 06/23/2011 Document Revised: 11/18/2013 Document Reviewed: 05/08/2013 °Exit[employer]® Patient Information ©2015 [employer]. This information is not intended to replace advice given to you by your health care provider. Make sure you discuss any questions you have with your health care provider. ° °

## 2015-01-08 NOTE — Progress Notes (Signed)
Patient Demographics  Makayla Livingston, is a 19 y.o. female  YTK:[id number]  FTD:[id number]  DOB - 08/18/1995  CC:  Chief Complaint  Patient presents with  . Hospitalization Follow-up       HPI: Makayla Livingston is a 19 y.o. female here today to establish medical care.Patient was recently hospitalized after having generalized seizures, EMR reviewed patient was treated with IV when necessary Haldol for agitation, patient was intubated, CT head was negative neurology was on board, EEG did not show any epileptiform activity after starting Keppra she had altered behavior and agitation she then it was changed to Dilantin, patient also had symptoms of depression psych started patient on Cymbalta and trazodone, patient was advised not to drive for 6 months and follow with neurology. Her blood work showed  anemia which was probably secondary to dilution since her anemia panel was normal, also noted hypokalemia, will need to repeat the blood chemistry, patient denies any more seizures since the discharge Patient has No headache, No chest pain, No abdominal pain - No Nausea, No new weakness tingling or numbness, No Cough - SOB.  Allergies  Allergen Reactions  . Penicillins Hives, Nausea And Vomiting and Rash  . Pollen Extract Other (See Comments)    Seasonal allergies   History reviewed. No pertinent past medical history. Current Outpatient Prescriptions on File Prior to Visit  Medication Sig Dispense Refill  . DULoxetine (CYMBALTA) 30 MG capsule Take 1 capsule (30 mg total) by mouth daily. 30 capsule 1  . phenytoin (DILANTIN) 300 MG ER capsule Take 1 capsule (300 mg total) by mouth at bedtime. 30 capsule 1  . traZODone (DESYREL) 50 MG tablet Take 1 tablet (50 mg total) by mouth at bedtime. 30 tablet 1  . naproxen (NAPROSYN) 500 MG tablet Take 500 mg by mouth daily.    . sucralfate (CARAFATE) 1 GM/10ML suspension Take 5 mLs (0.5 g total) by mouth 4 (four) times daily -  with meals and at bedtime.  420 mL 0   No current facility-administered medications on file prior to visit.   Family History  Problem Relation Age of Onset  . Hypertension Mother   . Cancer Maternal Grandmother     pancreatic cancer   . Cancer Maternal Grandfather     colonc cancer   History   Social History  . Marital Status: Single    Spouse Name: N/A  . Number of Children: N/A  . Years of Education: N/A   Occupational History  . Not on file.   Social History Main Topics  . Smoking status: Never Smoker   . Smokeless tobacco: Not on file  . Alcohol Use: No  . Drug Use: No  . Sexual Activity: Not on file   Other Topics Concern  . Not on file   Social History Narrative    Review of Systems: Constitutional: Negative for fever, chills, diaphoresis, activity change, appetite change and fatigue. HENT: Negative for ear pain, nosebleeds, congestion, facial swelling, rhinorrhea, neck pain, neck stiffness and ear discharge.  Eyes: Negative for pain, discharge, redness, itching and visual disturbance. Respiratory: Negative for cough, choking, chest tightness, shortness of breath, wheezing and stridor.  Cardiovascular: Negative for chest pain, palpitations and leg swelling. Gastrointestinal: Negative for abdominal distention. Genitourinary: Negative for dysuria, urgency, frequency, hematuria, flank pain, decreased urine volume, difficulty urinating and dyspareunia.  Musculoskeletal: Negative for back pain, joint swelling, arthralgia and gait problem. Neurological: Negative for dizziness, tremors, seizures, syncope, facial asymmetry, speech difficulty, weakness, light-headedness,  numbness and headaches.  Hematological: Negative for adenopathy. Does not bruise/bleed easily. Psychiatric/Behavioral: Negative for hallucinations, behavioral problems, confusion, dysphoric mood, decreased concentration and agitation.    Objective:   Filed Vitals:   01/08/15 0921  BP: 142/85  Pulse: 106  Temp: 98 F (36.7  C)  Resp: 16    Physical Exam: Constitutional: obese female sitting comfortably not in acute distress HENT: Normocephalic, atraumatic, External right and left ear normal. Oropharynx is clear and moist.  Eyes: Conjunctivae and EOM are normal. PERRLA, no scleral icterus. Neck: Normal ROM. Neck supple. No JVD. No tracheal deviation. No thyromegaly. CVS: RRR, S1/S2 +, no murmurs, no gallops, no carotid bruit.  Pulmonary: Effort and breath sounds normal, no stridor, rhonchi, wheezes, rales.  Abdominal: Soft. BS +, no distension, tenderness, rebound or guarding.  Musculoskeletal: Normal range of motion. No edema and no tenderness.  Neuro: Alert. Normal reflexes, muscle tone coordination. No cranial nerve deficit. Skin: Skin is warm and dry. No rash noted. Not diaphoretic. No erythema. No pallor. Psychiatric: Normal mood and affect. Behavior, judgment, thought content normal.  Lab Results  Component Value Date   WBC 5.5 12/29/2014   HGB 10.9* 12/29/2014   HCT 34.1* 12/29/2014   HCT 34.7 12/29/2014   MCV 73.0* 12/29/2014   PLT 342 12/29/2014   Lab Results  Component Value Date   CREATININE 0.72 12/29/2014   BUN <5* 12/29/2014   NA 141 12/29/2014   K 3.2* 12/29/2014   CL 106 12/29/2014   CO2 28 12/29/2014    No results found for: HGBA1C Lipid Panel     Component Value Date/Time   TRIG 74 12/25/2014 0217       Assessment and plan:   1. Hypokalemia Will repeat blood chemistry - COMPLETE METABOLIC PANEL WITH GFR  2. New onset seizure Currently on Dilantin, she has already been advised not to drive for 6 months. - Ambulatory referral to Neurology  3. Depression Symptoms are stable, currently patient is on Cymbalta and trazodone.  4. Elevated BP Advise patient for DASH diet   5. Screening  - Vit D  25 hydroxy (rtn osteoporosis monitoring)    Return in about 3 months (around 04/10/2015), or if symptoms worsen or fail to improve.    The patient was given clear  instructions to go to ER or return to medical center if symptoms don't improve, worsen or new problems develop. The patient verbalized understanding. The patient was told to call to get lab results if they haven't heard anything in the next week.    This note has been created with Education officer, environmental. Any transcriptional errors are unintentional.   Doris Cheadle, MD

## 2015-01-08 NOTE — Progress Notes (Signed)
Patient here for follow up from the hospital Patient was admitted for new onset of seizure Patient's mom said they think the seizures were related to stress

## 2015-01-09 LAB — VITAMIN D 25 HYDROXY (VIT D DEFICIENCY, FRACTURES): VIT D 25 HYDROXY: 5 ng/mL — AB (ref 30–100)

## 2015-01-12 ENCOUNTER — Other Ambulatory Visit: Payer: Self-pay | Admitting: Family Medicine

## 2015-01-12 DIAGNOSIS — E559 Vitamin D deficiency, unspecified: Secondary | ICD-10-CM

## 2015-01-12 MED ORDER — VITAMIN D (ERGOCALCIFEROL) 1.25 MG (50000 UNIT) PO CAPS: 50000.0000 [IU] | ORAL_CAPSULE | ORAL | Status: DC

## 2015-01-13 ENCOUNTER — Telehealth: Payer: Self-pay | Admitting: Internal Medicine

## 2015-01-13 ENCOUNTER — Telehealth: Payer: Self-pay

## 2015-01-13 NOTE — Telephone Encounter (Signed)
Pt is returning call from nurse. Please f/u.

## 2015-01-13 NOTE — Telephone Encounter (Signed)
-----   Message from Dessa PhiJosalyn Funches, MD sent at 01/12/2015  1:52 PM EDT ----- Vit D defm take 50 K U of D3 weekly for 12 weeks, ordered  Normal CMP, no longer hypokalemic Normal A1c

## 2015-01-13 NOTE — Telephone Encounter (Signed)
Patient not available Left message on  Voice mail to return our call 

## 2015-01-14 ENCOUNTER — Telehealth: Payer: Self-pay

## 2015-01-14 NOTE — Telephone Encounter (Signed)
Patient called returning nurse's call, please f/u with patient.

## 2015-01-14 NOTE — Telephone Encounter (Signed)
Returned patient phone call Patient not available Left message on voice mail to return our call 

## 2015-01-28 ENCOUNTER — Other Ambulatory Visit: Payer: Self-pay

## 2015-01-28 ENCOUNTER — Telehealth: Payer: Self-pay

## 2015-01-28 MED ORDER — DULOXETINE HCL 30 MG PO CPEP: 30.0000 mg | ORAL_CAPSULE | Freq: Every day | ORAL | Status: DC

## 2015-01-28 MED ORDER — TRAZODONE HCL 50 MG PO TABS: 50.0000 mg | ORAL_TABLET | Freq: Every day | ORAL | Status: DC

## 2015-01-28 MED ORDER — PHENYTOIN SODIUM EXTENDED 300 MG PO CAPS: 300.0000 mg | ORAL_CAPSULE | Freq: Every day | ORAL | Status: DC

## 2015-01-28 NOTE — Telephone Encounter (Signed)
Patient's mom called requesting a refill on trazadone Dilantin and cymbalta Per Dr Orpah Cobbadvani we will sent the prescriptions to the pharmacy on file

## 2015-02-09 ENCOUNTER — Ambulatory Visit: Payer: Managed Care, Other (non HMO) | Attending: Internal Medicine

## 2015-02-10 ENCOUNTER — Ambulatory Visit: Payer: Self-pay | Attending: Internal Medicine | Admitting: Internal Medicine

## 2015-02-10 ENCOUNTER — Encounter: Payer: Self-pay | Admitting: Internal Medicine

## 2015-02-10 VITALS — BP 138/83 | HR 102 | Temp 98.6°F | Resp 16 | Wt 353.8 lb

## 2015-02-10 DIAGNOSIS — R61 Generalized hyperhidrosis: Secondary | ICD-10-CM

## 2015-02-10 DIAGNOSIS — F32A Depression, unspecified: Secondary | ICD-10-CM

## 2015-02-10 DIAGNOSIS — E559 Vitamin D deficiency, unspecified: Secondary | ICD-10-CM

## 2015-02-10 DIAGNOSIS — F329 Major depressive disorder, single episode, unspecified: Secondary | ICD-10-CM

## 2015-02-10 DIAGNOSIS — G40909 Epilepsy, unspecified, not intractable, without status epilepticus: Secondary | ICD-10-CM

## 2015-02-10 MED ORDER — VITAMIN D (ERGOCALCIFEROL) 1.25 MG (50000 UNIT) PO CAPS: 50000.0000 [IU] | ORAL_CAPSULE | ORAL | Status: DC

## 2015-02-10 NOTE — Progress Notes (Signed)
MRN: [id number] Name: Makayla Livingston  Sex: female Age: 18 y.o. DOB: 07-Jan-1996  Allergies: Penicillins and Pollen extract  Chief Complaint  Patient presents with  . Excessive Sweating    HPI: Patient is 19 y.o. female who has history of seizure disorder, depression, last month she was hospitalized and at that time she was started on Cymbalta, trazodone to help with the depression, as per patient she has noticed excessive sweating after starting this medication, denies any SI or HI has been compliant in taking her medication, patient is also scheduled to follow with neurology next month currently on Dilantin, denies any seizures after the discharge.  History reviewed. No pertinent past medical history.  History reviewed. No pertinent past surgical history.    Medication List       This list is accurate as of: 02/10/15  5:25 PM.  Always use your most recent med list.               DULoxetine 30 MG capsule  Commonly known as:  CYMBALTA  Take 1 capsule (30 mg total) by mouth daily.     naproxen 500 MG tablet  Commonly known as:  NAPROSYN  Take 500 mg by mouth daily.     phenytoin 300 MG ER capsule  Commonly known as:  DILANTIN  Take 1 capsule (300 mg total) by mouth at bedtime.     sucralfate 1 GM/10ML suspension  Commonly known as:  CARAFATE  Take 5 mLs (0.5 g total) by mouth 4 (four) times daily -  with meals and at bedtime.     traZODone 50 MG tablet  Commonly known as:  DESYREL  Take 1 tablet (50 mg total) by mouth at bedtime.     Vitamin D (Ergocalciferol) 50000 UNITS Caps capsule  Commonly known as:  DRISDOL  Take 1 capsule (50,000 Units total) by mouth every 7 (seven) days. For 12 weeks        Meds ordered this encounter  Medications  . Vitamin D, Ergocalciferol, (DRISDOL) 50000 UNITS CAPS capsule    Sig: Take 1 capsule (50,000 Units total) by mouth every 7 (seven) days. For 12 weeks    Dispense:  4 capsule    Refill:  2     There is no  immunization history on file for this patient.  Family History  Problem Relation Age of Onset  . Hypertension Mother   . Cancer Maternal Grandmother     pancreatic cancer   . Cancer Maternal Grandfather     colonc cancer    History  Substance Use Topics  . Smoking status: Never Smoker   . Smokeless tobacco: Not on file  . Alcohol Use: No    Review of Systems   As noted in HPI  Filed Vitals:   02/10/15 1529  BP: 138/83  Pulse: 102  Temp: 98.6 F (37 C)  Resp: 16    Physical Exam  Physical Exam  Constitutional: No distress.  Eyes: EOM are normal. Pupils are equal, round, and reactive to light.  Cardiovascular: Normal rate and regular rhythm.   Pulmonary/Chest: Breath sounds normal. No respiratory distress. She has no wheezes. She has no rales.  Musculoskeletal: She exhibits no edema.    Labs   Lab Results  Component Value Date   WBC 5.5 12/29/2014   HGB 10.9* 12/29/2014   HCT 34.1* 12/29/2014   HCT 34.7 12/29/2014   PLT 342 12/29/2014   GLUCOSE 93 01/08/2015   TRIG 74 12/25/2014  ALT 19 01/08/2015   AST 18 01/08/2015   NA 138 01/08/2015   K 4.0 01/08/2015   CL 102 01/08/2015   CREATININE 0.78 01/08/2015   BUN 6 01/08/2015   CO2 27 01/08/2015   TSH 1.620 12/29/2014   HGBA1C 5.6 01/08/2015    Lab Results  Component Value Date   HGBA1C 5.6 01/08/2015     Assessment and Plan  Depression Patient is currently on trazodone, excessive sweating can be a side effect of Cymbalta, her TSH level has been normal, patient is in the process to see a psychiatrist, meanwhile she will continue with trazodone, she will take the Cymbalta every other day, patient is accompanied with her mother, advise patient to get immediate medical attention if she has any worsening of the symptoms or develops new symptoms, patient understand verbalized The instructions. . Excessive sweating Likely secondary to Cymbalta, patient is going to reduce the dose.  Seizure  disorder Currently patient is on Dilantin, scheduled her to follow with neurology.  Vitamin D deficiency - Plan: Started patient on Vitamin D, Ergocalciferol, (DRISDOL) 50000 UNITS CAPS capsule   Return in about 3 months (around 05/13/2015), or if symptoms worsen or fail to improve.   This note has been created with Education officer, environmental. Any transcriptional errors are unintentional.    Doris Cheadle, MD

## 2015-02-10 NOTE — Progress Notes (Signed)
Patient here for follow up Complains of excessive sweating since she started taking cymbalta No other complaints at this time

## 2015-03-02 ENCOUNTER — Ambulatory Visit (INDEPENDENT_AMBULATORY_CARE_PROVIDER_SITE_OTHER): Payer: Medicaid Other | Admitting: Neurology

## 2015-03-02 ENCOUNTER — Encounter: Payer: Self-pay | Admitting: Neurology

## 2015-03-02 VITALS — BP 118/82 | HR 93 | Resp 16 | Wt 357.0 lb

## 2015-03-02 DIAGNOSIS — G40201 Localization-related (focal) (partial) symptomatic epilepsy and epileptic syndromes with complex partial seizures, not intractable, with status epilepticus: Secondary | ICD-10-CM

## 2015-03-02 NOTE — Patient Instructions (Addendum)
1. Schedule open MRI brain with and without contrast 2. Schedule 1-hour EEG 3. Continue Dilantin  daily 4. Follow-up in 1 month, look into Lamotrigine/Lamictal as option for switching to different seizure medication  5. YOU HAVE BEEN SCHEDULED AT [facility] FOR MRI BRAIN ON 03/12/15. PLEASE ARRIVE @ 9:30 AM.       [address], [place], [place] [address]      PH: [phone]         Seizure Precautions: 1. If medication has been prescribed for you to prevent seizures, take it exactly as directed.  Do not stop taking the medicine without talking to your doctor first, even if you have not had a seizure in a long time.   2. Avoid activities in which a seizure would cause danger to yourself or to others.  Don't operate dangerous machinery, swim alone, or climb in high or dangerous places, such as on ladders, roofs, or girders.  Do not drive unless your doctor says you may.  3. If you have any warning that you may have a seizure, lay down in a safe place where you can't hurt yourself.    4.  No driving for 6 months from last seizure, as per [facility].   Please refer to the following link on the [employer]'s website for more information: http://www.epilepsyfoundation.org/answerplace/Social/driving/drivingu.cfm   5.  Maintain good sleep hygiene.  6.  Notify your neurology if you are planning pregnancy or if you become pregnant.  7.  Contact your doctor if you have any problems that may be related to the medicine you are taking.  8.  Call 911 and bring the patient back to the ED if:        A.  The seizure lasts longer than 5 minutes.       B.  The patient doesn't awaken shortly after the seizure  C.  The patient has new problems such as difficulty seeing, speaking or moving  D.  The patient was injured during the seizure  E.  The patient has a temperature over 102 F (39C)  F.  The patient vomited and now is having trouble breathing

## 2015-03-02 NOTE — Progress Notes (Signed)
NEUROLOGY CONSULTATION NOTE  Makayla Livingston MRN: [id number] DOB: Apr 06, 1996  Referring provider: Dr. Doris Cheadle Primary care provider: Dr. Doris Cheadle  Reason for consult:  seizures  Dear Dr Orpah Cobb:  Thank you for your kind referral of Makayla Livingston for consultation of the above symptoms. Although her history is well known to you, please allow me to reiterate it for the purpose of our medical record. The patient was accompanied to the clinic by her mother who also provides collateral information. Records and images were personally reviewed where available.  HISTORY OF PRESENT ILLNESS: This is a pleasant 19 year old right-handed woman with morbid obesity, in her usual state of health until 12/22/14 when she left school early because she was feeling unwell. She fell asleep until midnight, and spoke briefly to her mother, who did not note any confusion. The next day she woke up reporting her tongue felt swollen, they went to the ER with lesions on both side of her tongue, noted to have buccal and tongue ulcerations. She was diagnosed with herpangina and discharged with Carafate. She went home and did her homework, but that evening kept asking repeatedly what her mother was saying. She told her mother she could hear but could not understand what she was saying. This was chalked up to her tongue being swollen. She went to bed at 10pm, then at 4am her mother heard a loud sound and saw her in the bedroom with her room in disarray, she kept standing up then falling down, spinning in circles, making sound but mostly non-verbal. She did not recognize her mother and could not answer questions. When EMS arrived, she had 2 witnessed convulsions lasting 1 minute. Her mother reports she witnessed 3 more in the ER, all of the with note of head turn to the right per mother. ER notes indicate left gaze deviation. Records reviewed, she had a total of 7 convulsions that day until she was intubated for airway  protection. I personally reviewed CT head without contrast (unable to fit in MRI), which showed slightly higher attenuation over the anterior frontal lobes, most compatible with beam hardening artifact, no acute changes noted. Her sedated EEG reported intermittent right-sided sharp wave activity and focal slowing, appears to have a frontal predominance with possible phase reversal at F4. She had a lumbar puncture with CSF WBC 2, RBC 3, protein 13, glucose 64, HSV PCR and CSF culture negative. UDS negative except for benzodiazepines (received Versed en route). She was started on Keppra, however after extubation, she had a "mini-breakdown" thinking she was in a mental institution and pulled her IV out, crying and being very angry. Her mother reports she kept going on about her room. She recall this and recalls texting a friend asking if she was in a mental hospital and crying. This was attributed to Keppra, which was discontinued. Her mother reports her mood totally changed and improved that evening. She was started on Vimpat, then switched to Dilantin due to cost.   She denies any side effects on Dilantin. She feels her memory worsened after the seizures, but that it is slowly improving. It is her first day back to college today. She denies any olfactory/gustatory hallucinations, deja vu, rising epigastric sensation, focal numbness/tingling/weakness, myoclonic jerks. No focal weakness noted after the seizures. Her mother denies any staring/unresponsiveness, no gaps in time. She reports being sleep deprived the nights prior to the seizures, no alcohol use. Her mother reports that since her hospital stay, she has  had mood swings, quick to become upset and have an attitude. She was evaluated by Psychiatry during her hospitalization and was started on Cymbalta for depression and Trazodone for sleep. She will start seeing a therapist soon. She denies any headaches, dizziness, diplopia, dysarthria, dysphagia, neck/back  pain, bowel/bladder dysfunction. She denies any recent infections, travels, or vaccinations.   Epilepsy Risk Factors:  She had a normal birth and early development.  There is no history of febrile convulsions, CNS infections such as meningitis/encephalitis, significant traumatic brain injury, neurosurgical procedures, or family history of seizures.  Laboratory Data:  Lab Results  Component Value Date   WBC 5.5 12/29/2014   HGB 10.9* 12/29/2014   HCT 34.1* 12/29/2014   HCT 34.7 12/29/2014   MCV 73.0* 12/29/2014   PLT 342 12/29/2014     Chemistry      Component Value Date/Time   NA 138 01/08/2015 0948   K 4.0 01/08/2015 0948   CL 102 01/08/2015 0948   CO2 27 01/08/2015 0948   BUN 6 01/08/2015 0948   CREATININE 0.78 01/08/2015 0948   CREATININE 0.72 12/29/2014 1130      Component Value Date/Time   CALCIUM 9.4 01/08/2015 0948   ALKPHOS 109 01/08/2015 0948   AST 18 01/08/2015 0948   ALT 19 01/08/2015 0948   BILITOT 0.3 01/08/2015 0948     Lab Results  Component Value Date   TSH 1.620 12/29/2014   Lab Results  Component Value Date   VITAMINB12 583 12/29/2014   PAST MEDICAL HISTORY: Past Medical History  Diagnosis Date  . Depression     PAST SURGICAL HISTORY: No past surgical history on file.  MEDICATIONS: Current Outpatient Prescriptions on File Prior to Visit  Medication Sig Dispense Refill  . DULoxetine (CYMBALTA) 30 MG capsule Take 1 capsule (30 mg total) by mouth daily. 30 capsule 1  . phenytoin (DILANTIN) 300 MG ER capsule Take 1 capsule (300 mg total) by mouth at bedtime. 30 capsule 1  . traZODone (DESYREL) 50 MG tablet Take 1 tablet (50 mg total) by mouth at bedtime. 30 tablet 1  . Vitamin D, Ergocalciferol, (DRISDOL) 50000 UNITS CAPS capsule Take 1 capsule (50,000 Units total) by mouth every 7 (seven) days. For 12 weeks 4 capsule 2   No current facility-administered medications on file prior to visit.    ALLERGIES: Allergies  Allergen Reactions  .  Penicillins Hives, Nausea And Vomiting and Rash  . Pollen Extract Other (See Comments)    Seasonal allergies    FAMILY HISTORY: Family History  Problem Relation Age of Onset  . Hypertension Mother   . Cancer Maternal Grandmother     pancreatic cancer   . Cancer Maternal Grandfather     colonc cancer    SOCIAL HISTORY: Social History   Social History  . Marital Status: Single    Spouse Name: N/A  . Number of Children: N/A  . Years of Education: N/A   Occupational History  . Student    Social History Main Topics  . Smoking status: Never Smoker   . Smokeless tobacco: Never Used  . Alcohol Use: No  . Drug Use: No  . Sexual Activity: Not on file   Other Topics Concern  . Not on file   Social History Narrative    REVIEW OF SYSTEMS: Constitutional: No fevers, chills, or sweats, no generalized fatigue, change in appetite Eyes: No visual changes, double vision, eye pain Ear, nose and throat: No hearing loss, ear pain, nasal congestion, sore  throat Cardiovascular: No chest pain, palpitations Respiratory:  No shortness of breath at rest or with exertion, wheezes GastrointestinaI: No nausea, vomiting, diarrhea, abdominal pain, fecal incontinence Genitourinary:  No dysuria, urinary retention or frequency Musculoskeletal:  No neck pain, back pain Integumentary: No rash, pruritus, skin lesions Neurological: as above Psychiatric: No depression, insomnia, anxiety Endocrine: No palpitations, fatigue, diaphoresis, mood swings, change in appetite, change in weight, increased thirst Hematologic/Lymphatic:  No anemia, purpura, petechiae. Allergic/Immunologic: no itchy/runny eyes, nasal congestion, recent allergic reactions, rashes  PHYSICAL EXAM: Filed Vitals:   03/02/15 1019  BP: 118/82  Pulse: 93  Resp: 16   General: No acute distress, morbidly obese Head:  Normocephalic/atraumatic Eyes: Fundoscopic exam shows bilateral sharp discs, no vessel changes, exudates, or  hemorrhages Neck: supple, no paraspinal tenderness, full range of motion Back: No paraspinal tenderness Heart: regular rate and rhythm Lungs: Clear to auscultation bilaterally. Vascular: No carotid bruits. Skin/Extremities: No rash, no edema Neurological Exam: Mental status: alert and oriented to person, place, and time, no dysarthria or aphasia, Fund of knowledge is appropriate.  Recent and remote memory are intact. 3/3 delayed recall. Attention and concentration are normal.    Able to name objects and repeat phrases. Cranial nerves: CN I: not tested CN II: pupils equal, round and reactive to light, visual fields intact, fundi unremarkable. CN III, IV, VI:  full range of motion, no nystagmus, no ptosis CN V: facial sensation intact CN VII: upper and lower face symmetric CN VIII: hearing intact to finger rub CN IX, X: gag intact, uvula midline CN XI: sternocleidomastoid and trapezius muscles intact CN XII: tongue midline Bulk & Tone: normal, no fasciculations. Motor: 5/5 throughout with no pronator drift. Sensation: intact to light touch, cold, pin, vibration and joint position sense.  No extinction to double simultaneous stimulation.  Romberg test negative Deep Tendon Reflexes: brisk +2 throughout, no ankle clonus Plantar responses: upgoing toe on right, downgoing on left Cerebellar: no incoordination on finger to nose, heel to shin. No dysdiadochokinesia Gait: narrow-based and steady, able to tandem walk adequately. Tremor: none  IMPRESSION: This is a 19 year old right-handed woman with morbid obesity, no other clear epilepsy risk factors, presenting with new onset seizures last June 2016. She was admitted 12/24/14 with a total of 7 convulsions that day, and prior to that she had woken up with tongue ulcerations and was noted to have some confusion. Her sedated EEG showed possible sharp waves over the right frontal region. Head CT reported as unremarkable. Neurological exam today  non-focal except for note of upgoing toe on the right. Seizures described as stereotyped with head turn to the right per mother, suggestive of localization-related epilepsy. An open MRI brain with and without contrast and repeat 1-hour wake and sleep EEG will be ordered to further classify her seizures. We discussed Dilantin use, she denies having any side effects, however in women of childbearing age, I would recommend switching to Lamotrigine. We discussed side effects of Lamotrigine, including Levonne Spiller syndrome. She will continue on Dilantin for now, with plans for possible switch on her follow-up visit after the tests. [place] driving laws were discussed with the patient, and she knows to stop driving after a seizure, until 6 months seizure-free. We discussed avoidance of seizure triggers. She will continue to monitor cognitive changes. She will follow-up in 1 month and knows to call our office for any changes.   Thank you for allowing me to participate in the care of this patient. Please do not  hesitate to call for any questions or concerns.   Patrcia Dolly, M.D.  CC: Dr. Orpah Cobb

## 2015-03-12 ENCOUNTER — Telehealth: Payer: Self-pay | Admitting: Family Medicine

## 2015-03-12 NOTE — Telephone Encounter (Signed)
Lmovm to return my call. 

## 2015-03-12 NOTE — Telephone Encounter (Signed)
-----   Message from [name], MD sent at 03/12/2015  1:28 PM EDT ----- Regarding: MRI results Pls let patient know I reviewed MRI brain, it's normal, no evidence of tumor, stroke, or bleed.Thanks

## 2015-03-12 NOTE — Telephone Encounter (Signed)
Patient returned my call. Notified her of result. 

## 2015-03-18 ENCOUNTER — Ambulatory Visit (INDEPENDENT_AMBULATORY_CARE_PROVIDER_SITE_OTHER): Payer: Medicaid Other | Admitting: Neurology

## 2015-03-18 DIAGNOSIS — G40201 Localization-related (focal) (partial) symptomatic epilepsy and epileptic syndromes with complex partial seizures, not intractable, with status epilepticus: Secondary | ICD-10-CM | POA: Diagnosis not present

## 2015-03-20 NOTE — Procedures (Signed)
ELECTROENCEPHALOGRAM REPORT  Date of Study: 03/18/2015  Patient's Name: Makayla Livingston MRN: [id number] Date of Birth: 05-28-1996  Referring Provider: Dr. Patrcia Dolly  Clinical History: This is a 19 year old woman with new onset seizures last June 2016. She was admitted 12/24/14 with a total of 7 convulsions that day, and prior to that she had woken up with tongue ulcerations and was noted to have some confusion  Medications: Dilantin, Cymbalta, Trazodone  Technical Summary: A multichannel digital 1-hour EEG recording measured by the international 10-20 system with electrodes applied with paste and impedances below 5000 ohms performed in our laboratory with EKG monitoring in an awake and asleep patient.  Hyperventilation and photic stimulation were performed.  The digital EEG was referentially recorded, reformatted, and digitally filtered in a variety of bipolar and referential montages for optimal display.    Description: The patient is awake and asleep during the recording.  During maximal wakefulness, there is a symmetric, medium voltage 10 Hz posterior dominant rhythm that attenuates with eye opening.  The record is symmetric.  During drowsiness and sleep, there is an increase in theta slowing of the background.  Vertex waves and symmetric sleep spindles were seen.  Hyperventilation and photic stimulation did not elicit any abnormalities.  There were no epileptiform discharges or electrographic seizures seen.    EKG lead was unremarkable.  Impression: This 1-hour awake and asleep EEG is normal.    Clinical Correlation: A normal EEG does not exclude a clinical diagnosis of epilepsy.  If further clinical questions remain, prolonged EEG may be helpful.  Clinical correlation is advised.   Patrcia Dolly, M.D.

## 2015-03-26 ENCOUNTER — Other Ambulatory Visit: Payer: Self-pay | Admitting: Internal Medicine

## 2015-03-27 ENCOUNTER — Telehealth: Payer: Self-pay

## 2015-03-27 MED ORDER — DULOXETINE HCL 30 MG PO CPEP: 30.0000 mg | ORAL_CAPSULE | Freq: Every day | ORAL | Status: DC

## 2015-03-27 MED ORDER — TRAZODONE HCL 50 MG PO TABS: 50.0000 mg | ORAL_TABLET | Freq: Every day | ORAL | Status: DC

## 2015-03-27 NOTE — Telephone Encounter (Signed)
Patient called requesting a refill on her cymbalta and trazodone [facility]

## 2015-04-10 ENCOUNTER — Ambulatory Visit (INDEPENDENT_AMBULATORY_CARE_PROVIDER_SITE_OTHER): Payer: Medicaid Other | Admitting: Neurology

## 2015-04-10 ENCOUNTER — Encounter: Payer: Self-pay | Admitting: Neurology

## 2015-04-10 VITALS — BP 122/84 | HR 108 | Resp 16 | Ht 67.0 in | Wt 355.0 lb

## 2015-04-10 DIAGNOSIS — G40201 Localization-related (focal) (partial) symptomatic epilepsy and epileptic syndromes with complex partial seizures, not intractable, with status epilepticus: Secondary | ICD-10-CM | POA: Diagnosis not present

## 2015-04-10 DIAGNOSIS — F411 Generalized anxiety disorder: Secondary | ICD-10-CM | POA: Diagnosis not present

## 2015-04-10 MED ORDER — PHENYTOIN SODIUM EXTENDED 100 MG PO CAPS: ORAL_CAPSULE | ORAL | Status: DC

## 2015-04-10 MED ORDER — LAMOTRIGINE ER 25 MG PO TB24: ORAL_TABLET | ORAL | Status: DC

## 2015-04-10 NOTE — Patient Instructions (Signed)
1. Start Lamotrigine ER : Take 1 tablet daily for 2 weeks, then increase to 2 tablets daily 2. Continue Phenytoin : 3 capsules daily 3. Continue Trazodone  at night 4. Follow-up in 4 weeks

## 2015-04-10 NOTE — Progress Notes (Signed)
NEUROLOGY FOLLOW UP OFFICE NOTE  Makayla Livingston [id number]  HISTORY OF PRESENT ILLNESS: I had the pleasure of seeing Makayla Livingston in follow-up in the neurology clinic on 04/10/2015.  The patient was last seen 6 weeks ago after she had new onset 7 convulsive seizures last 12/22/14. Records and images were personally reviewed where available. I personally reviewed MRI brain with and without contrast which was normal, hippocampi symmetric. Her 1-hour EEG was normal. She was discharged home on Dilantin  daily. Since her initial visit, she denies any further convulsions. She however was crying in the office today, reporting near-daily panic attacks that occur when she gets upset or when too much stuff is happening. Other times, she is just sitting in the bathroom or watching YouTube videos then suddenly starts feeling very anxious. With milder ones, she can take a few breaths and feel better. She has had bigger ones where her heart races and she starts feeling anxious for 1 minute, then symptoms resolve. She reports these milder panic attacks started at the end of high school.   She denies any headaches, dizziness, diplopia, dysarthria, dysphagia, focal numbness/tingling/weakness, neck/back pain, bowel/bladder dysfunction. No gaps in time, staring/unresponsive episodes, olfactory/gustatory hallucinations.  HPI: This is a pleasant 19 yo RH woman with morbid obesity, in her usual state of health until 12/22/14 when she left school early because she was feeling unwell. She fell asleep until midnight, and spoke briefly to her mother, who did not note any confusion. The next day she woke up reporting her tongue felt swollen, they went to the ER with lesions on both side of her tongue, noted to have buccal and tongue ulcerations. She was diagnosed with herpangina and discharged with Carafate. She went home and did her homework, but that evening kept asking repeatedly what her mother was saying. She told her  mother she could hear but could not understand what she was saying. This was chalked up to her tongue being swollen. She went to bed at 10pm, then at 4am her mother heard a loud sound and saw her in the bedroom with her room in disarray, she kept standing up then falling down, spinning in circles, making sound but mostly non-verbal. She did not recognize her mother and could not answer questions. When EMS arrived, she had 2 witnessed convulsions lasting 1 minute. Her mother reports she witnessed 3 more in the ER, all of the with note of head turn to the right per mother. ER notes indicate left gaze deviation. Records reviewed, she had a total of 7 convulsions that day until she was intubated for airway protection. I personally reviewed CT head without contrast (unable to fit in MRI), which showed slightly higher attenuation over the anterior frontal lobes, most compatible with beam hardening artifact, no acute changes noted. Her sedated EEG reported intermittent right-sided sharp wave activity and focal slowing, appears to have a frontal predominance with possible phase reversal at F4. She had a lumbar puncture with CSF WBC 2, RBC 3, protein 13, glucose 64, HSV PCR and CSF culture negative. UDS negative except for benzodiazepines (received Versed en route). She was started on Keppra, however after extubation, she had a "mini-breakdown" thinking she was in a mental institution and pulled her IV out, crying and being very angry. Her mother reports she kept going on about her room. She recall this and recalls texting a friend asking if she was in a mental hospital and crying. This was attributed to Keppra, which was discontinued.  Her mother reports her mood totally changed and improved that evening. She was started on Vimpat, then switched to Dilantin due to cost.   She denies any side effects on Dilantin. She feels her memory worsened after the seizures, but that it is slowly improving. It is her first day back to  college today. She denies any olfactory/gustatory hallucinations, deja vu, rising epigastric sensation, focal numbness/tingling/weakness, myoclonic jerks. No focal weakness noted after the seizures. Her mother denies any staring/unresponsiveness, no gaps in time. She reports being sleep deprived the nights prior to the seizures, no alcohol use. Her mother reports that since her hospital stay, she has had mood swings, quick to become upset and have an attitude. She was evaluated by Psychiatry during her hospitalization and was started on Cymbalta for depression and Trazodone for sleep. She will start seeing a therapist soon. She denies anyShe denies any recent infections, travels, or vaccinations.   Epilepsy Risk Factors: She had a normal birth and early development. There is no history of febrile convulsions, CNS infections such as meningitis/encephalitis, significant traumatic brain injury, neurosurgical procedures, or family history of seizures.  PAST MEDICAL HISTORY: Past Medical History  Diagnosis Date  . Depression     MEDICATIONS: Current Outpatient Prescriptions on File Prior to Visit  Medication Sig Dispense Refill  . DULoxetine (CYMBALTA) 30 MG capsule Take 1 capsule (30 mg total) by mouth daily. 30 capsule 1  . phenytoin (DILANTIN) 100 MG ER capsule Take 3 capsules (300 mg total) by mouth at bedtime. 30 capsule 1  . traZODone (DESYREL) 50 MG tablet Take 1 tablet (50 mg total) by mouth at bedtime. 30 tablet 1  . Vitamin D, Ergocalciferol, (DRISDOL) 50000 UNITS CAPS capsule Take 1 capsule (50,000 Units total) by mouth every 7 (seven) days. For 12 weeks 4 capsule 2   No current facility-administered medications on file prior to visit.    ALLERGIES: Allergies  Allergen Reactions  . Penicillins Hives, Nausea And Vomiting and Rash  . Pollen Extract Other (See Comments)    Seasonal allergies    FAMILY HISTORY: Family History  Problem Relation Age of Onset  . Hypertension Mother     . Cancer Maternal Grandmother     pancreatic cancer   . Cancer Maternal Grandfather     colonc cancer    SOCIAL HISTORY: Social History   Social History  . Marital Status: Single    Spouse Name: N/A  . Number of Children: N/A  . Years of Education: N/A   Occupational History  . Student    Social History Main Topics  . Smoking status: Never Smoker   . Smokeless tobacco: Never Used  . Alcohol Use: No  . Drug Use: No  . Sexual Activity: Not on file   Other Topics Concern  . Not on file   Social History Narrative    REVIEW OF SYSTEMS: Constitutional: No fevers, chills, or sweats, no generalized fatigue, change in appetite Eyes: No visual changes, double vision, eye pain Ear, nose and throat: No hearing loss, ear pain, nasal congestion, sore throat Cardiovascular: No chest pain, palpitations Respiratory:  No shortness of breath at rest or with exertion, wheezes GastrointestinaI: No nausea, vomiting, diarrhea, abdominal pain, fecal incontinence Genitourinary:  No dysuria, urinary retention or frequency Musculoskeletal:  No neck pain, back pain Integumentary: No rash, pruritus, skin lesions Neurological: as above Psychiatric: No depression, insomnia,+ anxiety Endocrine: No palpitations, fatigue, diaphoresis, mood swings, change in appetite, change in weight, increased thirst Hematologic/Lymphatic:  No  anemia, purpura, petechiae. Allergic/Immunologic: no itchy/runny eyes, nasal congestion, recent allergic reactions, rashes  PHYSICAL EXAM: Filed Vitals:   04/10/15 1538  BP: 122/84  Pulse: 108  Resp: 16   General: No acute distress Head:  Normocephalic/atraumatic Neck: supple, no paraspinal tenderness, full range of motion Heart:  Regular rate and rhythm Lungs:  Clear to auscultation bilaterally Back: No paraspinal tenderness Skin/Extremities: No rash, no edema Neurological Exam: alert and oriented to person, place, and time. No aphasia or dysarthria. Fund of  knowledge is appropriate.  Recent and remote memory are intact.  Attention and concentration are normal.    Able to name objects and repeat phrases. Cranial nerves: Pupils equal, round, reactive to light.  Fundoscopic exam unremarkable, no papilledema. Extraocular movements intact with no nystagmus. Visual fields full. Facial sensation intact. No facial asymmetry. Tongue, uvula, palate midline.  Motor: Bulk and tone normal, muscle strength 5/5 throughout with no pronator drift.  Sensation to light touch intact.  No extinction to double simultaneous stimulation.  Deep tendon reflexes 2+ throughout, toes downgoing.  Finger to nose testing intact.  Gait narrow-based and steady, able to tandem walk adequately.  Romberg negative.  IMPRESSION: This is a 18 yo RH woman with morbid obesity, no other clear epilepsy risk factors, with new onset seizures last June 2016. She was admitted 12/24/14 with a total of 7 convulsions that day, and prior to that she had woken up with tongue ulcerations and was noted to have some confusion. Her sedated EEG showed possible sharp waves over the right frontal region. MRI brain with and without contrast normal, repeat 1-hour wake and sleep EEG normal. She denies any further seizures since June, but has been having significant anxiety and panic attacks. She is currently on Dilantin  daily. We had discussed switching to Lamictal on her initial visit. I think this may help with mood stabilization as well as for long-term management in women with epilepsy (pregnancy planning in the future). We again discussed side effects of Lamotrigine, including Levonne Spiller syndrome. She will continue on Dilantin  qhs for now, start uptitration with Lamotrigine ER  daily x 2 weeks, then increase to  daily. She will follow-up in 4 weeks, at which point we will continue with uptitration of Lamotrigine and start weaning off Dilantin. She is aware of [place] driving laws to stop driving after a  seizure, until 6 months seizure-free. She will follow-up in 1 month and knows to call our office for any changes.   Thank you for allowing me to participate in her care.  Please do not hesitate to call for any questions or concerns.  The duration of this appointment visit was 25 minutes of face-to-face time with the patient.  Greater than 50% of this time was spent in counseling, explanation of diagnosis, planning of further management, and coordination of care.   Patrcia Dolly, M.D.   CC: Dr. Orpah Cobb

## 2015-04-13 ENCOUNTER — Telehealth: Payer: Self-pay | Admitting: Neurology

## 2015-04-13 NOTE — Telephone Encounter (Signed)
Makayla Livingston, from [facility]/ called about a refill/for " Lamictal"// [phone]

## 2015-04-14 NOTE — Telephone Encounter (Signed)
Lamictal XR is $300. Immediate release Rx is $10. Can Rx be changed?

## 2015-04-14 NOTE — Telephone Encounter (Signed)
Left msg on patients vm to rtn my call. 

## 2015-04-14 NOTE — Telephone Encounter (Signed)
Ok to change Rx to Lamotrigine : Take 1 tab BID for 2 weeks, then increase to 2 tabs BID and continue. Pls let patient know about change, extended-release is too expensive, however immediate-release is twice a day. Thanks

## 2015-04-15 ENCOUNTER — Telehealth: Payer: Self-pay | Admitting: Neurology

## 2015-04-15 MED ORDER — LAMOTRIGINE 25 MG PO TABS: ORAL_TABLET | ORAL | Status: DC

## 2015-04-15 NOTE — Telephone Encounter (Signed)
I spoke with patient about Rx cost for Lamictal XR. Will send new rx to her pharmacy for Lamictal IR 25 mg with the directions as they are stated below.   I also called the pharmacy back and spoke with [facility] to make of aware of the changes as well.

## 2015-04-15 NOTE — Telephone Encounter (Signed)
Pt returned your call @ [phone]

## 2015-04-15 NOTE — Telephone Encounter (Signed)
Pt/mother/Makayla Livingston/ returned call/ call pt back @ [phone]

## 2015-04-15 NOTE — Telephone Encounter (Signed)
Lmovm to return my call. 

## 2015-04-28 ENCOUNTER — Encounter (HOSPITAL_COMMUNITY): Payer: Self-pay | Admitting: Physical Medicine and Rehabilitation

## 2015-04-28 ENCOUNTER — Emergency Department (HOSPITAL_COMMUNITY)
Admission: EM | Admit: 2015-04-28 | Discharge: 2015-04-28 | Disposition: A | Discharge status: Home / Self Care | Payer: Medicaid Other | Attending: Emergency Medicine | Admitting: Emergency Medicine

## 2015-04-28 DIAGNOSIS — M778 Other enthesopathies, not elsewhere classified: Secondary | ICD-10-CM | POA: Diagnosis not present

## 2015-04-28 DIAGNOSIS — Z79899 Other long term (current) drug therapy: Secondary | ICD-10-CM | POA: Diagnosis not present

## 2015-04-28 DIAGNOSIS — Z880 Allergy status to penicillin: Secondary | ICD-10-CM | POA: Insufficient documentation

## 2015-04-28 DIAGNOSIS — R200 Anesthesia of skin: Secondary | ICD-10-CM | POA: Diagnosis present

## 2015-04-28 DIAGNOSIS — F329 Major depressive disorder, single episode, unspecified: Secondary | ICD-10-CM | POA: Insufficient documentation

## 2015-04-28 NOTE — ED Provider Notes (Signed)
CSN: [id number]     Arrival date & time 04/28/15  0830 History   First MD Initiated Contact with Patient 04/28/15 [phone]     Chief Complaint  Patient presents with  . Numbness     (Consider location/radiation/quality/duration/timing/severity/associated sxs/prior Treatment) HPI  PCP: Doris Cheadle, MD PMH: depression  Makayla Livingston is a 19 y.o.  female  CHIEF COMPLAINT: numbness to right hand  Patient reports right thumb, pointer and middle finger at times feel tingly and her fingers feel sore and tight. The symptoms have been ongoing since August when she started school. She reports more hand writing, typing and increased text messaging as well. Her symptoms get worse after texting a lot of scrolling the web. She had some improvement of her right hand (she is right hand dominant) when she started using her left hand for texting and scrolling the web but she then started to get stiffness and numbness in the left hand. She denies having any weakness or numbness, she denies pain at her elbow, neck or headache. She has not had any weakness, fevers, vision changes, aphagia, swelling, or rash    Past Medical History  Diagnosis Date  . Depression    History reviewed. No pertinent past surgical history. Family History  Problem Relation Age of Onset  . Hypertension Mother   . Cancer Maternal Grandmother     pancreatic cancer   . Cancer Maternal Grandfather     colonc cancer   Social History  Substance Use Topics  . Smoking status: Never Smoker   . Smokeless tobacco: Never Used  . Alcohol Use: No   OB History    No data available     Review of Systems  10 Systems reviewed and are negative for acute change except as noted in the HPI.     Allergies  Penicillins and Pollen extract  Home Medications   Prior to Admission medications   Medication Sig Start Date End Date Taking? Authorizing Provider  DULoxetine (CYMBALTA) 30 MG capsule Take 1 capsule (30 mg total) by mouth  daily. 03/27/15   Quentin Angst, MD  lamoTRIgine (LAMICTAL) 25 MG tablet Take 1 tablet twice daily for 2 weeks, then take 2 tablets twice daily and continue. 04/15/15   [name], MD  phenytoin (DILANTIN) 100 MG ER capsule Take 3 capsules daily 04/10/15   [name], MD  traZODone (DESYREL) 50 MG tablet Take 1 tablet (50 mg total) by mouth at bedtime. 03/27/15   Quentin Angst, MD  Vitamin D, Ergocalciferol, (DRISDOL) 50000 UNITS CAPS capsule Take 1 capsule (50,000 Units total) by mouth every 7 (seven) days. For 12 weeks 02/10/15   Doris Cheadle, MD   BP 154/83 mmHg  Pulse 101  Temp(Src) 98.4 F (36.9 C) (Oral)  SpO2 98% Physical Exam  Constitutional: She appears well-developed and well-nourished. No distress.  HENT:  Head: Normocephalic and atraumatic.  Eyes: Pupils are equal, round, and reactive to light.  Neck: Normal range of motion. Neck supple.  Cardiovascular: Normal rate and regular rhythm.   Pulmonary/Chest: Effort normal.  Abdominal: Soft.  Musculoskeletal:       Right hand: Normal.       Left hand: She exhibits normal range of motion, no tenderness, no bony tenderness, normal two-point discrimination, normal capillary refill, no deformity, no laceration and no swelling. Normal sensation noted. Normal strength noted.  Neurological: She is alert.  Skin: Skin is warm and dry.  Nursing note and vitals reviewed.  ED Course  Procedures (including critical care time) Labs Review Labs Reviewed - No data to display  Imaging Review No results found. I have personally reviewed and evaluated these images and lab results as part of my medical decision-making.   EKG Interpretation None      MDM   Final diagnoses:  Tendonitis of wrist, right   Patients symptoms consistent with overuse injury. Better with rest and worsens with more use.  Discussed lifestyle changes and will immobilize for 1 week with velcro thumb spica splint. Referral to hand. Patient also  advised to stretch and ice.  She has had no associated symptoms of the hand going cold, truly numb, weak, or neck pain/headache.  Medications - No data to display  19 y.o.Parissa Chiao Gutzmer's medical screening exam was performed and I feel the patient has had an appropriate workup for their chief complaint at this time and likelihood of emergent condition existing is low. They have been counseled on decision, discharge, follow up and which symptoms necessitate immediate return to the emergency department. They or their family verbally stated understanding and agreement with Livingston and discharged in stable condition.   Vital signs are stable at discharge. Filed Vitals:   04/28/15 0845  BP: 154/83  Pulse: 101  Temp: 98.4 F (36.9 C)       Makayla Pel, PA-C 04/28/15 0940  Makayla Plan, DO 04/28/15 [phone]

## 2015-04-28 NOTE — ED Notes (Signed)
R thumb spica splint applied, pt tolerated without difficulty. Pt discharged home, encouraged to follow up with orthopedic surgeon, has no questions upon discharge.

## 2015-04-28 NOTE — ED Notes (Signed)
Pt reports numbness to bilateral hands, ongoing since August. States hands feel "tight." strong bilateral equal hand grips. No neurological deficits noted. Pt is alert and oriented x4.

## 2015-04-28 NOTE — Discharge Instructions (Signed)
° °  Tendonitis Tennis elbow is puffiness (inflammation) of the outer tendons of your forearm close to your elbow. Your tendons attach your muscles to your bones. Tennis elbow can happen in any sport or job in which you use your elbow too much. It is caused by doing the same motion over and over. Tennis elbow can cause:  Pain and tenderness in your forearm and the outer part of your elbow.  A burning feeling. This runs from your elbow through your arm.  Weak grip in your hands. HOME CARE Activity  Rest your elbow and wrist as told by your doctor. Try to avoid any activities that caused the problem until your doctor says that you can do them again.  If a physical therapist teaches you exercises, do all of them as told.  If you lift an object, lift it with your palm facing up. This is easier on your elbow. Lifestyle  If your tennis elbow is caused by sports, check your equipment and make sure that:  You are using it correctly.  It fits you well.  If your tennis elbow is caused by work, take breaks often, if you are able. Talk with your manager about doing your work in a way that is safe for you.  If your tennis elbow is caused by computer use, talk with your manager about any changes that can be made to your work setup. General Instructions  If told, apply ice to the painful area:  Put ice in a plastic bag.  Place a towel between your skin and the bag.  Leave the ice on for 20 minutes, 2-3 times per day.  Take medicines only as told by your doctor.  If you were given a brace, wear it as told by your doctor.  Keep all follow-up visits as told by your doctor. This is important. GET HELP IF:  Your pain does not get better with treatment.  Your pain gets worse.  You have weakness in your forearm, hand, or fingers.  You cannot feel your forearm, hand, or fingers.   This information is not intended to replace advice given to you by your health care provider. Make sure you  discuss any questions you have with your health care provider.   Document Released: 12/22/2009 Document Revised: 11/18/2014 Document Reviewed: 06/30/2014 Elsevier Interactive Patient Education [employer]

## 2015-05-11 ENCOUNTER — Ambulatory Visit: Payer: Self-pay | Admitting: Neurology

## 2015-05-14 ENCOUNTER — Ambulatory Visit: Payer: Medicaid Other | Attending: Family Medicine | Admitting: Family Medicine

## 2015-05-14 ENCOUNTER — Encounter: Payer: Self-pay | Admitting: Family Medicine

## 2015-05-14 ENCOUNTER — Ambulatory Visit (INDEPENDENT_AMBULATORY_CARE_PROVIDER_SITE_OTHER): Payer: Medicaid Other | Admitting: Neurology

## 2015-05-14 ENCOUNTER — Encounter: Payer: Self-pay | Admitting: Neurology

## 2015-05-14 ENCOUNTER — Encounter: Payer: Self-pay | Admitting: Clinical

## 2015-05-14 VITALS — BP 118/84 | HR 101 | Ht 67.0 in | Wt 362.0 lb

## 2015-05-14 VITALS — BP 134/84 | HR 84 | Temp 98.6°F | Resp 18 | Ht 67.0 in | Wt 362.0 lb

## 2015-05-14 DIAGNOSIS — F411 Generalized anxiety disorder: Secondary | ICD-10-CM | POA: Diagnosis not present

## 2015-05-14 DIAGNOSIS — G40201 Localization-related (focal) (partial) symptomatic epilepsy and epileptic syndromes with complex partial seizures, not intractable, with status epilepticus: Secondary | ICD-10-CM | POA: Diagnosis not present

## 2015-05-14 DIAGNOSIS — F419 Anxiety disorder, unspecified: Secondary | ICD-10-CM | POA: Diagnosis present

## 2015-05-14 DIAGNOSIS — R569 Unspecified convulsions: Secondary | ICD-10-CM | POA: Insufficient documentation

## 2015-05-14 DIAGNOSIS — E559 Vitamin D deficiency, unspecified: Secondary | ICD-10-CM | POA: Insufficient documentation

## 2015-05-14 DIAGNOSIS — Z79899 Other long term (current) drug therapy: Secondary | ICD-10-CM | POA: Insufficient documentation

## 2015-05-14 DIAGNOSIS — Z6841 Body Mass Index (BMI) 40.0 and over, adult: Secondary | ICD-10-CM | POA: Insufficient documentation

## 2015-05-14 DIAGNOSIS — F329 Major depressive disorder, single episode, unspecified: Secondary | ICD-10-CM

## 2015-05-14 MED ORDER — LAMOTRIGINE 100 MG PO TABS: ORAL_TABLET | ORAL | Status: DC

## 2015-05-14 NOTE — Patient Instructions (Signed)
Makayla Livingston was seen today for medication management.  Diagnoses and all orders for this visit:  Vitamin D deficiency -     Vitamin D, 25-hydroxy  Generalized anxiety disorder   F/u in 6 months for anxiety, sooner if needed  Dr. Armen PickupFunches

## 2015-05-14 NOTE — Progress Notes (Signed)
F/U medication  No hx tobacco No pain today

## 2015-05-14 NOTE — Progress Notes (Signed)
NEUROLOGY FOLLOW UP OFFICE NOTE  Makayla Livingston [id number]  HISTORY OF PRESENT ILLNESS: I had the pleasure of seeing Makayla Livingston in follow-up in the neurology clinic on 05/14/2015.  The patient was last seen 1 month ago after she had new onset 7 convulsive seizures last 12/22/14. No further seizures since then. She was discharged home on Dilantin  qhs. On her last visit, she was crying, reporting near-daily panic attacks when she gets upset or overwhelmed. She was started on Lamictal to hopefully help with mood and transition her to a different AED. Lamictal XR was cost-prohibitive, she was given a prescription instead for immediate-release Lamotrigine  with BID instructions, but misunderstood and has only been taking  qhs. She denies any side effects. She does feel that mood is more stabilized (less ups and downs), and the panic attacks have subsided, she occasionally feels her heart beating fast but it does not progress. She however continues to feel the same way anxiety-wise, she feels unable to work, she does not want to go outside, and she does not want to be in bigger groups. She will be seeing a therapist soon. She denies any headaches, dizziness, diplopia, dysarthria, dysphagia, focal numbness/tingling/weakness, neck/back pain, bowel/bladder dysfunction. No gaps in time, staring/unresponsive episodes, olfactory/gustatory hallucinations.  HPI: This is a pleasant 19 yo RH woman with morbid obesity, in her usual state of health until 12/22/14 when she left school early because she was feeling unwell. She fell asleep until midnight, and spoke briefly to her mother, who did not note any confusion. The next day she woke up reporting her tongue felt swollen, they went to the ER with lesions on both side of her tongue, noted to have buccal and tongue ulcerations. She was diagnosed with herpangina and discharged with Carafate. She went home and did her homework, but that evening kept asking  repeatedly what her mother was saying. She told her mother she could hear but could not understand what she was saying. This was chalked up to her tongue being swollen. She went to bed at 10pm, then at 4am her mother heard a loud sound and saw her in the bedroom with her room in disarray, she kept standing up then falling down, spinning in circles, making sound but mostly non-verbal. She did not recognize her mother and could not answer questions. When EMS arrived, she had 2 witnessed convulsions lasting 1 minute. Her mother reports she witnessed 3 more in the ER, all of the with note of head turn to the right per mother. ER notes indicate left gaze deviation. Records reviewed, she had a total of 7 convulsions that day until she was intubated for airway protection. I personally reviewed CT head without contrast (unable to fit in MRI), which showed slightly higher attenuation over the anterior frontal lobes, most compatible with beam hardening artifact, no acute changes noted. Her sedated EEG reported intermittent right-sided sharp wave activity and focal slowing, appears to have a frontal predominance with possible phase reversal at F4. She had a lumbar puncture with CSF WBC 2, RBC 3, protein 13, glucose 64, HSV PCR and CSF culture negative. UDS negative except for benzodiazepines (received Versed en route). She was started on Keppra, however after extubation, she had a "mini-breakdown" thinking she was in a mental institution and pulled her IV out, crying and being very angry. Her mother reports she kept going on about her room. She recall this and recalls texting a friend asking if she was in a mental  hospital and crying. This was attributed to Keppra, which was discontinued. Her mother reports her mood totally changed and improved that evening. She was started on Vimpat, then switched to Dilantin due to cost.   She denies any side effects on Dilantin. She feels her memory worsened after the seizures, but that  it is slowly improving. It is her first day back to college today. She denies any olfactory/gustatory hallucinations, deja vu, rising epigastric sensation, focal numbness/tingling/weakness, myoclonic jerks. No focal weakness noted after the seizures. Her mother denies any staring/unresponsiveness, no gaps in time. She reports being sleep deprived the nights prior to the seizures, no alcohol use. Her mother reports that since her hospital stay, she has had mood swings, quick to become upset and have an attitude. She was evaluated by Psychiatry during her hospitalization and was started on Cymbalta for depression and Trazodone for sleep. She will start seeing a therapist soon. She denies anyShe denies any recent infections, travels, or vaccinations.   Epilepsy Risk Factors: She had a normal birth and early development. There is no history of febrile convulsions, CNS infections such as meningitis/encephalitis, significant traumatic brain injury, neurosurgical procedures, or family history of seizures.  PAST MEDICAL HISTORY: Past Medical History  Diagnosis Date  . Depression     MEDICATIONS: Current Outpatient Prescriptions on File Prior to Visit  Medication Sig Dispense Refill  . DULoxetine (CYMBALTA) 30 MG capsule Take 1 capsule (30 mg total) by mouth daily. 30 capsule 1  . lamoTRIgine (LAMICTAL) 25 MG tablet Take 1 tablet twice daily for 2 weeks, then take 2 tablets twice daily and continue. (Patient taking differently: Take 2 tablets qhs) 120 tablet 3  . phenytoin (DILANTIN) 100 MG ER capsule Take 3 capsules daily 90 capsule 3  . traZODone (DESYREL) 50 MG tablet Take 1 tablet (50 mg total) by mouth at bedtime. 30 tablet 1   No current facility-administered medications on file prior to visit.    ALLERGIES: Allergies  Allergen Reactions  . Penicillins Hives, Nausea And Vomiting and Rash  . Pollen Extract Other (See Comments)    Seasonal allergies    FAMILY HISTORY: Family History    Problem Relation Age of Onset  . Hypertension Mother   . Cancer Maternal Grandmother     pancreatic cancer   . Cancer Maternal Grandfather     colonc cancer    SOCIAL HISTORY: Social History   Social History  . Marital Status: Single    Spouse Name: N/A  . Number of Children: N/A  . Years of Education: N/A   Occupational History  . Student    Social History Main Topics  . Smoking status: Never Smoker   . Smokeless tobacco: Never Used  . Alcohol Use: No  . Drug Use: No  . Sexual Activity: Not on file   Other Topics Concern  . Not on file   Social History Narrative    REVIEW OF SYSTEMS: Constitutional: No fevers, chills, or sweats, no generalized fatigue, change in appetite Eyes: No visual changes, double vision, eye pain Ear, nose and throat: No hearing loss, ear pain, nasal congestion, sore throat Cardiovascular: No chest pain, palpitations Respiratory:  No shortness of breath at rest or with exertion, wheezes GastrointestinaI: No nausea, vomiting, diarrhea, abdominal pain, fecal incontinence Genitourinary:  No dysuria, urinary retention or frequency Musculoskeletal:  No neck pain, back pain Integumentary: No rash, pruritus, skin lesions Neurological: as above Psychiatric: + depression,no insomnia, +anxiety Endocrine: No palpitations, fatigue, diaphoresis, mood swings, change  in appetite, change in weight, increased thirst Hematologic/Lymphatic:  No anemia, purpura, petechiae. Allergic/Immunologic: no itchy/runny eyes, nasal congestion, recent allergic reactions, rashes  PHYSICAL EXAM: Filed Vitals:   05/14/15 1146  BP: 118/84  Pulse: 101   General: No acute distress Head:  Normocephalic/atraumatic Neck: supple, no paraspinal tenderness, full range of motion Heart:  Regular rate and rhythm Lungs:  Clear to auscultation bilaterally Back: No paraspinal tenderness Skin/Extremities: No rash, no edema Neurological Exam: alert and oriented to person, place,  and time. No aphasia or dysarthria. Fund of knowledge is appropriate.  Recent and remote memory are intact.  Attention and concentration are normal.    Able to name objects and repeat phrases. Cranial nerves: Pupils equal, round, reactive to light.  Fundoscopic exam unremarkable, no papilledema. Extraocular movements intact with no nystagmus. Visual fields full. Facial sensation intact. No facial asymmetry. Tongue, uvula, palate midline.  Motor: Bulk and tone normal, muscle strength 5/5 throughout with no pronator drift.  Sensation to light touch intact.  No extinction to double simultaneous stimulation.  Deep tendon reflexes 2+ throughout, toes downgoing.  Finger to nose testing intact.  Gait narrow-based and steady, able to tandem walk adequately.  Romberg negative.  IMPRESSION: This is a 19 yo RH woman with morbid obesity, no other clear epilepsy risk factors, with new onset seizures last June 2016. She was admitted 12/24/14 with a total of 7 convulsions that day, and prior to that she had woken up with tongue ulcerations and was noted to have some confusion. Her sedated EEG showed possible sharp waves over the right frontal region. MRI brain with and without contrast normal, repeat 1-hour wake and sleep EEG normal. She denies any further seizures since June 2016. She was reporting significant anxiety and panic attacks, and was started on Lamotrigine to help with mood stabilization. She does feel mood is better, with no further panic attacks, but she continues to have anxiety about going out the house. She will be seeing a therapist soon. We again discussed issues in women with epilepsy, and plan to transition her to monotherapy with Lamotrigine. She will increase Lamotrigine to  BID x 2 weeks, then  BID. After 2 weeks this dose, she will start tapering Dilantin by 1 capsule every 2 weeks. We discussed risks of breakthrough seizures during any medication adjustment. She is not driving and is aware of [place]  driving laws to stop driving after a seizure, until 6 months seizure-free. She will follow-up in 3 months and knows to call our office for any changes.   Thank you for allowing me to participate in her care.  Please do not hesitate to call for any questions or concerns.  The duration of this appointment visit was 25 minutes of face-to-face time with the patient.  Greater than 50% of this time was spent in counseling, explanation of diagnosis, planning of further management, and coordination of care.   Patrcia Dolly, M.D.   CC: Dr. Orpah Cobb

## 2015-05-14 NOTE — Progress Notes (Signed)
Subjective:  Patient ID: Makayla Livingston, female    DOB: 20-Jul-1995  Age: 19 y.o. MRN: [id number]  CC: Medication Management   HPI Makayla HongBriani S Abbey presents for   1. Anxiety: x 4 years. Initial triggers unknown. Current triggers are unclear. Had a bad panic attack at home, in car. Characterized by palpitations and shortness. Improving since lamictal. Started lamictal 4 weeks ago. No recent seizure. She has fear of being in crowds. Has never had therapy. Requesting letter stating she is currently unable to work due to anxiety. She is in school at [employer] studying genera education. Plans to tranfers to [employer] for biochemistry/psychology. Current coping mechanism include listening to music.    2. Seizures: last seizure in June of 2016. Followed by neurology. Tapering off dilantin. Replacing it with lamictal.   3. Vit D deficiency: took vit D replacement.   4. Morbid obesity: patient eats out often. Does not exercise. Feels she knows how to eat and declines nutritionist referral. Plans to start walking for exercise.   Social History  Substance Use Topics  . Smoking status: Never Smoker   . Smokeless tobacco: Never Used  . Alcohol Use: No    Outpatient Prescriptions Prior to Visit  Medication Sig Dispense Refill  . DULoxetine (CYMBALTA) 30 MG capsule Take 1 capsule (30 mg total) by mouth daily. 30 capsule 1  . lamoTRIgine (LAMICTAL) 100 MG tablet Take 1 tablet twice a day 60 tablet 6  . phenytoin (DILANTIN) 100 MG ER capsule Take 3 capsules daily 90 capsule 3  . traZODone (DESYREL) 50 MG tablet Take 1 tablet (50 mg total) by mouth at bedtime. 30 tablet 1   No facility-administered medications prior to visit.    ROS Review of Systems  Constitutional: Negative for fever and chills.  Eyes: Negative for visual disturbance.  Respiratory: Negative for shortness of breath.   Cardiovascular: Negative for chest pain.  Gastrointestinal: Negative for abdominal pain and blood in stool.    Musculoskeletal: Negative for back pain and arthralgias.  Skin: Negative for rash.  Allergic/Immunologic: Negative for immunocompromised state.  Neurological: Negative for seizures.  Hematological: Negative for adenopathy. Does not bruise/bleed easily.  Psychiatric/Behavioral: Negative for suicidal ideas and dysphoric mood.    Objective:  BP 134/84 mmHg  Pulse 84  Temp(Src) 98.6 F (37 C) (Oral)  Resp 18  Ht 5\' 7"  (1.702 m)  Wt 362 lb (164.202 kg)  BMI 56.68 kg/m2  SpO2 96%  LMP 04/22/2015  BP/Weight 05/14/2015 05/14/2015 04/28/2015  Systolic BP 118 134 148  Diastolic BP 84 84 80  Wt. (Lbs) 362 362 -  BMI 56.68 56.68 -   Physical Exam  Constitutional: She is oriented to person, place, and time. She appears well-developed and well-nourished. No distress.  HENT:  Head: Normocephalic and atraumatic.  Cardiovascular: Normal rate, regular rhythm, normal heart sounds and intact distal pulses.   Pulmonary/Chest: Effort normal and breath sounds normal.  Musculoskeletal: She exhibits no edema.  Neurological: She is alert and oriented to person, place, and time.  Skin: Skin is warm and dry. No rash noted.  Psychiatric: She has a normal mood and affect.     Assessment & Plan:   Problem List Items Addressed This Visit    Generalized anxiety disorder (Chronic)   Vitamin D deficiency - Primary   Relevant Orders   Vitamin D, 25-hydroxy      No orders of the defined types were placed in this encounter.    Follow-up: No Follow-up on  file.   Boykin Nearing MD

## 2015-05-14 NOTE — Progress Notes (Unsigned)
ASSESSMENT: Pt currently experiencing symptoms of anxiety and depression. [facility]; would benefit from psychoeducation and supportive counseling regarding coping with symptoms of anxiety and depression.  Stage of Change: ***  PLAN: 1. F/U with behavioral health consultant in as needed 2. Psychiatric Medications: Cymbalta, Desyrel 3. Behavioral recommendation(s):   -Consider reading educational materials regarding coping with symptoms of anxiety and depression -Consider obtaining [place] bus [place] for reduced rides  -Consider applying for Medicaid transportation -[facility]  SUBJECTIVE: Pt. referred by Dr Armen PickupFunches for symptoms of anxiety and depression:  Pt. reports the following symptoms/concerns: Pt states that she is not suicidal, she worries about her seizures and going to school [employer]), hoping to transfer to [employer] in fall 2017; in the past, she would sleep only 2-3 hours/night, but is sleeping 8-12 hours/night now, mostly 8, but sometimes more when she is feeling overwhelmed. Her mother drives her to appointments, she has Medicaid.  Duration of problem: 4 years since first seizure Severity: severe  OBJECTIVE: Orientation & Cognition: Oriented x3. Thought processes normal and appropriate to situation. Mood: appropriate. Affect: appropriate Appearance: appropriate Risk of harm to self or others: no known risk of harm to self or others Substance use: none known Assessments administered: PHQ9: 24/ GAD7: 16  Diagnosis: Anxiety and depression CPT Code: F41.8 -------------------------------------------- Other(s) present in the room: none  Time spent with patient in exam room: 20 minutes

## 2015-05-14 NOTE — Patient Instructions (Signed)
1. Increase Lamictal 25mg : Take 2 tablets twice a day. Once you are done with your bottle, start new prescription for Lamictal 100mg : take 1 tablet twice a day. 2. After 2 weeks of taking Lamictal 100mg  twice a day, start reducing the Dilantin 100mg : Take 2 capsules daily for 2 weeks, then 1 capsule daily for 2 weeks, then stop Dilantin. Continue on Lamictal 3. Proceed with therapy as scheduled 4. Follow-up in 3 months, call for any problems  Seizure Precautions: 1. If medication has been prescribed for you to prevent seizures, take it exactly as directed.  Do not stop taking the medicine without talking to your doctor first, even if you have not had a seizure in a long time.   2. Avoid activities in which a seizure would cause danger to yourself or to others.  Don't operate dangerous machinery, swim alone, or climb in high or dangerous places, such as on ladders, roofs, or girders.  Do not drive unless your doctor says you may.  3. If you have any warning that you may have a seizure, lay down in a safe place where you can't hurt yourself.    4.  No driving for 6 months from last seizure, as [place] [facility] [place].   Please refer to the following link on the Epilepsy Foundation of America's website for more information: http://www.epilepsyfoundation.org/answerplace/Social/driving/drivingu.cfm   5.  Maintain good sleep hygiene.  6.  Notify your neurology if you are planning pregnancy or if you become pregnant.  7.  Contact your doctor if you have any problems that may be related to the medicine you are taking.  8.  Call 911 and bring the patient back to the ED if:        A.  The seizure lasts longer than 5 minutes.       B.  The patient doesn't awaken shortly after the seizure  C.  The patient has new problems such as difficulty seeing, speaking or moving  D.  The patient was injured during the seizure  E.  The patient has a temperature over 102 F (39C)  F.  The patient vomited and  now is having trouble breathing

## 2015-05-15 LAB — VITAMIN D 25 HYDROXY (VIT D DEFICIENCY, FRACTURES): Vit D, 25-Hydroxy: 15 ng/mL — ABNORMAL LOW (ref 30–100)

## 2015-05-15 MED ORDER — VITAMIN D (ERGOCALCIFEROL) 1.25 MG (50000 UNIT) PO CAPS: 50000.0000 [IU] | ORAL_CAPSULE | ORAL | Status: DC

## 2015-05-15 NOTE — Addendum Note (Signed)
Addended by: Dessa PhiFUNCHES, Zionah Criswell on: 05/15/2015 11:51 AM   Modules accepted: Orders

## 2015-05-27 ENCOUNTER — Other Ambulatory Visit: Payer: Self-pay | Admitting: Internal Medicine

## 2015-05-27 ENCOUNTER — Telehealth: Payer: Self-pay | Admitting: Neurology

## 2015-05-27 NOTE — Telephone Encounter (Signed)
Pt/ [phone] Trazodon  called to request refills for/

## 2015-05-27 NOTE — Telephone Encounter (Signed)
Pt called back to request refills for? Trazodone 50 ml/Phenytoin 100 mg/Duloextine 30 mg capsule//[phone]

## 2015-05-27 NOTE — Telephone Encounter (Signed)
Pt called for refills of// Trazodone 50 ml tab/Phenytoin 100 mg//Duloxetine 30 mg capsule

## 2015-05-28 NOTE — Telephone Encounter (Signed)
Pt. Called requesting a med refill on the following medications:  traZODone (DESYREL) 50 MG tablet phenytoin (DILANTIN) 100 MG ER capsule DULoxetine (CYMBALTA) 30 MG capsule  Please f/u with pt.

## 2015-05-28 NOTE — Telephone Encounter (Signed)
Trying to reach patient, busy signal.

## 2015-05-28 NOTE — Telephone Encounter (Signed)
I spoke with patient. She should have refills on file at her pharmacy for the Dilantin advised her to call pharmacy and request a refill. As far as the Trazodone and the Cymbalta she needs to call the prescribing physcian's office for those refills.

## 2015-06-01 ENCOUNTER — Telehealth: Payer: Self-pay | Admitting: Family Medicine

## 2015-06-01 DIAGNOSIS — F321 Major depressive disorder, single episode, moderate: Secondary | ICD-10-CM

## 2015-06-01 NOTE — Telephone Encounter (Signed)
Pt calling once more. It has been well over 48 hours since the initial contact. Pt explains that she really needs her medication and would like to know the status of this request. Please contact pt at the earliest convenience. Thank you, Makayla BasemanSadie Reynolds, ASA

## 2015-06-01 NOTE — Telephone Encounter (Signed)
Patient called requesting a medication refill for traZODone (DESYREL) and cymbalta Please follow up with patient

## 2015-06-03 NOTE — Telephone Encounter (Signed)
Ms. [name],  I have received two messages from you regarding patient prescriptions.  I am not a primary care physician and do not write any prescriptions.  These are not my patients or prescriptions.

## 2015-06-05 MED ORDER — TRAZODONE HCL 50 MG PO TABS: 50.0000 mg | ORAL_TABLET | Freq: Every day | ORAL | Status: DC

## 2015-06-05 MED ORDER — DULOXETINE HCL 30 MG PO CPEP: 30.0000 mg | ORAL_CAPSULE | Freq: Every day | ORAL | Status: DC

## 2015-06-10 ENCOUNTER — Telehealth: Payer: Self-pay | Admitting: *Deleted

## 2015-06-10 NOTE — Telephone Encounter (Signed)
-----   Message from Dessa PhiJosalyn Funches, MD sent at 05/15/2015 11:50 AM EDT ----- Vit D def Vit D supplement ordered

## 2015-06-10 NOTE — Telephone Encounter (Signed)
LVM to return call.

## 2015-07-29 MED FILL — DULoxetine HCL 30 MG CPEP: 30 | 30 days supply | Qty: 30 | Fill #0

## 2015-07-29 MED FILL — lamoTRIgine 100 MG TABS: 100 | 30 days supply | Qty: 60 | Fill #2

## 2015-07-29 MED FILL — traZODone HCL 50 MG TABS: 50 | 30 days supply | Qty: 30 | Fill #0

## 2015-08-17 ENCOUNTER — Ambulatory Visit (INDEPENDENT_AMBULATORY_CARE_PROVIDER_SITE_OTHER): Payer: Self-pay | Admitting: Neurology

## 2015-08-17 ENCOUNTER — Encounter: Payer: Self-pay | Admitting: Neurology

## 2015-08-17 VITALS — BP 108/74 | HR 113 | Ht 67.0 in | Wt 360.0 lb

## 2015-08-17 DIAGNOSIS — G40201 Localization-related (focal) (partial) symptomatic epilepsy and epileptic syndromes with complex partial seizures, not intractable, with status epilepticus: Secondary | ICD-10-CM

## 2015-08-17 DIAGNOSIS — G4700 Insomnia, unspecified: Secondary | ICD-10-CM

## 2015-08-17 DIAGNOSIS — F321 Major depressive disorder, single episode, moderate: Secondary | ICD-10-CM

## 2015-08-17 DIAGNOSIS — F411 Generalized anxiety disorder: Secondary | ICD-10-CM

## 2015-08-17 MED ORDER — TRAZODONE HCL 50 MG PO TABS: ORAL_TABLET | ORAL | Status: DC

## 2015-08-17 MED ORDER — LAMOTRIGINE 100 MG PO TABS: ORAL_TABLET | ORAL | Status: DC

## 2015-08-17 NOTE — Progress Notes (Signed)
NEUROLOGY FOLLOW UP OFFICE NOTE  DARWIN GUASTELLA [id number]  HISTORY OF PRESENT ILLNESS: I had the pleasure of seeing Makayla Livingston in follow-up in the neurology clinic on 08/17/2015.  The patient was last seen 3 months ago after she had new onset 7 convulsive seizures last 12/22/14. No further seizures since June 2016. She had emotional changes with Dilantin, and has switched to Lamictal, currently on  BID and off Dilantin. She denies any seizures or seizure-like symptoms. The anxiety did improve with therapy, but due to insurance issues she had to briefly stop. She is taking Cymbalta and Trazodone for depression. Her main concern today is sleep difficulty since stopping Dilantin, which was making her drowsy. She has difficulty both with sleep initiation and maintenance, getting an average of 6-10 hours of sleep. She does snore but denies any apneic episodes. She denies any headaches, dizziness, diplopia, dysarthria, dysphagia, focal numbness/tingling/weakness, neck/back pain, bowel/bladder dysfunction. No gaps in time, staring/unresponsive episodes, olfactory/gustatory hallucinations.  HPI: This is a pleasant 20 yo RH woman with morbid obesity, in her usual state of health until 12/22/14 when she left school early because she was feeling unwell. She fell asleep until midnight, and spoke briefly to her mother, who did not note any confusion. The next day she woke up reporting her tongue felt swollen, they went to the ER with lesions on both side of her tongue, noted to have buccal and tongue ulcerations. She was diagnosed with herpangina and discharged with Carafate. She went home and did her homework, but that evening kept asking repeatedly what her mother was saying. She told her mother she could hear but could not understand what she was saying. This was chalked up to her tongue being swollen. She went to bed at 10pm, then at 4am her mother heard a loud sound and saw her in the bedroom with her room in  disarray, she kept standing up then falling down, spinning in circles, making sound but mostly non-verbal. She did not recognize her mother and could not answer questions. When EMS arrived, she had 2 witnessed convulsions lasting 1 minute. Her mother reports she witnessed 3 more in the ER, all of the with note of head turn to the right per mother. ER notes indicate left gaze deviation. Records reviewed, she had a total of 7 convulsions that day until she was intubated for airway protection. I personally reviewed CT head without contrast (unable to fit in MRI), which showed slightly higher attenuation over the anterior frontal lobes, most compatible with beam hardening artifact, no acute changes noted. Her sedated EEG reported intermittent right-sided sharp wave activity and focal slowing, appears to have a frontal predominance with possible phase reversal at F4. She had a lumbar puncture with CSF WBC 2, RBC 3, protein 13, glucose 64, HSV PCR and CSF culture negative. UDS negative except for benzodiazepines (received Versed en route). She was started on Keppra, however after extubation, she had a "mini-breakdown" thinking she was in a mental institution and pulled her IV out, crying and being very angry. Her mother reports she kept going on about her room. She recall this and recalls texting a friend asking if she was in a mental hospital and crying. This was attributed to Keppra, which was discontinued. Her mother reports her mood totally changed and improved that evening. She was started on Vimpat, then switched to Dilantin due to cost.   She denies any side effects on Dilantin. She feels her memory worsened after the seizures,  but that it is slowly improving. It is her first day back to college today. She denies any olfactory/gustatory hallucinations, deja vu, rising epigastric sensation, focal numbness/tingling/weakness, myoclonic jerks. No focal weakness noted after the seizures. Her mother denies any  staring/unresponsiveness, no gaps in time. She reports being sleep deprived the nights prior to the seizures, no alcohol use. Her mother reports that since her hospital stay, she has had mood swings, quick to become upset and have an attitude. She was evaluated by Psychiatry during her hospitalization and was started on Cymbalta for depression and Trazodone for sleep. She will start seeing a therapist soon. She denies anyShe denies any recent infections, travels, or vaccinations.   Epilepsy Risk Factors: She had a normal birth and early development. There is no history of febrile convulsions, CNS infections such as meningitis/encephalitis, significant traumatic brain injury, neurosurgical procedures, or family history of seizures.  PAST MEDICAL HISTORY: Past Medical History  Diagnosis Date  . Depression     MEDICATIONS: Current Outpatient Prescriptions on File Prior to Visit  Medication Sig Dispense Refill  . DULoxetine (CYMBALTA) 30 MG capsule Take 1 capsule (30 mg total) by mouth daily. 30 capsule 1  . lamoTRIgine (LAMICTAL) 25 MG tablet Take 1 tablet twice daily for 2 weeks, then take 2 tablets twice daily and continue. (Patient taking differently: Take 2 tablets qhs) 120 tablet 3  . phenytoin (DILANTIN) 100 MG ER capsule Take 3 capsules daily 90 capsule 3  . traZODone (DESYREL) 50 MG tablet Take 1 tablet (50 mg total) by mouth at bedtime. 30 tablet 1   No current facility-administered medications on file prior to visit.    ALLERGIES: Allergies  Allergen Reactions  . Penicillins Hives, Nausea And Vomiting and Rash  . Pollen Extract Other (See Comments)    Seasonal allergies    FAMILY HISTORY: Family History  Problem Relation Age of Onset  . Hypertension Mother   . Cancer Maternal Grandmother     pancreatic cancer   . Cancer Maternal Grandfather     colonc cancer    SOCIAL HISTORY: Social History   Social History  . Marital Status: Single    Spouse Name: N/A  . Number  of Children: N/A  . Years of Education: N/A   Occupational History  . Student    Social History Main Topics  . Smoking status: Never Smoker   . Smokeless tobacco: Never Used  . Alcohol Use: No  . Drug Use: No  . Sexual Activity: Not on file   Other Topics Concern  . Not on file   Social History Narrative    REVIEW OF SYSTEMS: Constitutional: No fevers, chills, or sweats, no generalized fatigue, change in appetite Eyes: No visual changes, double vision, eye pain Ear, nose and throat: No hearing loss, ear pain, nasal congestion, sore throat Cardiovascular: No chest pain, palpitations Respiratory:  No shortness of breath at rest or with exertion, wheezes GastrointestinaI: No nausea, vomiting, diarrhea, abdominal pain, fecal incontinence Genitourinary:  No dysuria, urinary retention or frequency Musculoskeletal:  No neck pain, back pain Integumentary: No rash, pruritus, skin lesions Neurological: as above Psychiatric: + depression,no insomnia, +anxiety Endocrine: No palpitations, fatigue, diaphoresis, mood swings, change in appetite, change in weight, increased thirst Hematologic/Lymphatic:  No anemia, purpura, petechiae. Allergic/Immunologic: no itchy/runny eyes, nasal congestion, recent allergic reactions, rashes  PHYSICAL EXAM: Filed Vitals:   08/17/15 1522  BP: 108/74  Pulse: 113   General: No acute distress Head:  Normocephalic/atraumatic Neck: supple, no paraspinal tenderness,  full range of motion Heart:  Regular rate and rhythm Lungs:  Clear to auscultation bilaterally Back: No paraspinal tenderness Skin/Extremities: No rash, no edema Neurological Exam: alert and oriented to person, place, and time. No aphasia or dysarthria. Fund of knowledge is appropriate.  Recent and remote memory are intact. 3/3 delayed recall.   Attention and concentration are normal.    Able to name objects and repeat phrases. Cranial nerves: Pupils equal, round, reactive to light. Extraocular  movements intact with no nystagmus. Visual fields full. Facial sensation intact. No facial asymmetry. Tongue, uvula, palate midline.  Motor: Bulk and tone normal, muscle strength 5/5 throughout with no pronator drift.  Sensation to light touch intact.  No extinction to double simultaneous stimulation.  Deep tendon reflexes 2+ throughout, toes downgoing.  Finger to nose testing intact.  Gait narrow-based and steady, able to tandem walk adequately.  Romberg negative.  IMPRESSION: This is a pleasant 20 yo RH woman with morbid obesity, no other clear epilepsy risk factors, with new onset seizures last June 2016. She was admitted 12/24/14 with a total of 7 convulsions that day, and prior to that she had woken up with tongue ulcerations and was noted to have some confusion. Her sedated EEG showed possible sharp waves over the right frontal region. MRI brain with and without contrast normal, repeat 1-hour wake and sleep EEG normal. She denies any further seizures since June 2016. She was reporting significant anxiety and panic attacks, and was started on Lamotrigine to help with mood stabilization. She is currently on  BID and off Dilantin, with improvement in mood. Therapy also helped with anxiety, she will resume once able. Main concern today is sleep. She will increase Trazodone to  qhs and practice good sleep hygiene. If still ineffective, she will be referred for a sleep study. She is not driving and is aware of [place] driving laws to stop driving after a seizure, until 6 months seizure-free. She will follow-up in 3 months and knows to call our office for any changes.   Thank you for allowing me to participate in her care.  Please do not hesitate to call for any questions or concerns.  The duration of this appointment visit was 25 minutes of face-to-face time with the patient.  Greater than 50% of this time was spent in counseling, explanation of diagnosis, planning of further management, and coordination  of care.   Patrcia Dolly, M.D.   CC: Dr. Armen Pickup

## 2015-08-17 NOTE — Patient Instructions (Addendum)
1. Continue Lamictal  twice a day 2. Increase Trazodone : Take 2 tablets at night 3. Practice good sleep hygiene 4. Resume therapy for anxiety once able 5. Follow-up in 3 months, call for any changes  Seizure Precautions: 1. If medication has been prescribed for you to prevent seizures, take it exactly as directed.  Do not stop taking the medicine without talking to your doctor first, even if you have not had a seizure in a long time.   2. Avoid activities in which a seizure would cause danger to yourself or to others.  Don't operate dangerous machinery, swim alone, or climb in high or dangerous places, such as on ladders, roofs, or girders.  Do not drive unless your doctor says you may.  3. If you have any warning that you may have a seizure, lay down in a safe place where you can't hurt yourself.    4.  No driving for 6 months from last seizure, as per [facility].   Please refer to the following link on the Epilepsy Foundation of America's website for more information: http://www.epilepsyfoundation.org/answerplace/Social/driving/drivingu.cfm   5.  Maintain good sleep hygiene. Avoid alcohol   6.  Notify your neurology if you are planning pregnancy or if you become pregnant.  7.  Contact your doctor if you have any problems that may be related to the medicine you are taking.  8.  Call 911 and bring the patient back to the ED if:        A.  The seizure lasts longer than 5 minutes.       B.  The patient doesn't awaken shortly after the seizure  C.  The patient has new problems such as difficulty seeing, speaking or moving  D.  The patient was injured during the seizure  E.  The patient has a temperature over 102 F (39C)  F.  The patient vomited and now is having trouble breathing

## 2015-08-24 MED FILL — traZODone HCL 50 MG TABS: 50 | 30 days supply | Qty: 60 | Fill #0

## 2015-08-24 MED FILL — DULoxetine HCL 30 MG CPEP: 30 | 30 days supply | Qty: 30 | Fill #1

## 2015-08-24 MED FILL — lamoTRIgine 100 MG TABS: 100 | 30 days supply | Qty: 60 | Fill #3

## 2015-09-21 MED FILL — traZODone HCL 50 MG TABS: 50 | 30 days supply | Qty: 60 | Fill #1 | Status: TO

## 2015-09-21 MED FILL — DULoxetine HCL 30 MG CPEP: 30 | 30 days supply | Qty: 30 | Fill #2 | Status: TO

## 2015-09-21 MED FILL — lamoTRIgine 100 MG TABS: 100 | 30 days supply | Qty: 60 | Fill #4 | Status: TO

## 2015-09-23 ENCOUNTER — Telehealth: Payer: Self-pay | Admitting: Neurology

## 2015-09-23 NOTE — Telephone Encounter (Signed)
She states she's applying for social security. She's asking for a letter stating what her dx is, how long she's been your patient, and what medications you have prescribed for her and how she is to take them.

## 2015-09-23 NOTE — Telephone Encounter (Signed)
Pt states that she is returning your call and needs to talk to you about a letter please call [phone]

## 2015-09-29 ENCOUNTER — Ambulatory Visit: Payer: Medicaid Other | Attending: Family Medicine | Admitting: Family Medicine

## 2015-09-29 ENCOUNTER — Encounter: Payer: Self-pay | Admitting: Neurology

## 2015-09-29 ENCOUNTER — Encounter: Payer: Self-pay | Admitting: Family Medicine

## 2015-09-29 VITALS — BP 128/85 | HR 103 | Temp 98.8°F | Resp 16 | Ht 68.0 in | Wt 372.0 lb

## 2015-09-29 DIAGNOSIS — F329 Major depressive disorder, single episode, unspecified: Secondary | ICD-10-CM | POA: Diagnosis not present

## 2015-09-29 DIAGNOSIS — Z79899 Other long term (current) drug therapy: Secondary | ICD-10-CM | POA: Diagnosis not present

## 2015-09-29 DIAGNOSIS — G40209 Localization-related (focal) (partial) symptomatic epilepsy and epileptic syndromes with complex partial seizures, not intractable, without status epilepticus: Secondary | ICD-10-CM | POA: Diagnosis not present

## 2015-09-29 DIAGNOSIS — Z6841 Body Mass Index (BMI) 40.0 and over, adult: Secondary | ICD-10-CM | POA: Diagnosis not present

## 2015-09-29 DIAGNOSIS — E559 Vitamin D deficiency, unspecified: Secondary | ICD-10-CM | POA: Diagnosis not present

## 2015-09-29 DIAGNOSIS — L219 Seborrheic dermatitis, unspecified: Secondary | ICD-10-CM | POA: Insufficient documentation

## 2015-09-29 DIAGNOSIS — R21 Rash and other nonspecific skin eruption: Secondary | ICD-10-CM | POA: Diagnosis present

## 2015-09-29 DIAGNOSIS — G40909 Epilepsy, unspecified, not intractable, without status epilepticus: Secondary | ICD-10-CM | POA: Diagnosis not present

## 2015-09-29 DIAGNOSIS — F411 Generalized anxiety disorder: Secondary | ICD-10-CM | POA: Diagnosis not present

## 2015-09-29 LAB — POCT GLYCOSYLATED HEMOGLOBIN (HGB A1C): HEMOGLOBIN A1C: 5.4

## 2015-09-29 MED ORDER — KETOCONAZOLE 2 % EX SHAM: 1.0000 "application " | MEDICATED_SHAMPOO | CUTANEOUS | Status: DC

## 2015-09-29 MED ORDER — KETOCONAZOLE 2 % EX CREA: 1.0000 "application " | TOPICAL_CREAM | Freq: Two times a day (BID) | CUTANEOUS | Status: DC

## 2015-09-29 NOTE — Telephone Encounter (Signed)
Done, thanks

## 2015-09-29 NOTE — Progress Notes (Signed)
Subjective:  Patient ID: Makayla Livingston, female    DOB: 12/31/1995  Age: 20 y.o. MRN: [id number]  CC: Rash   HPI Makayla Livingston presents for    1. Rash on face: on and off: for 2-3 years. Flaky, itchy rash. In scalp, ears, eyebrows. Tender when it flakes.  2. Requesting letter: would like a letter stating she cannot work due to her medical problems and medications. She is followed by neuro and psychiatry. Her meds are prescribed by her neurologist.   Patient Active Problem List   Diagnosis Date Noted  . Generalized anxiety disorder 04/10/2015    Priority: High  . Morbid obesity (HCC) 12/30/2014    Priority: High  . MDD (major depressive disorder), single episode 12/29/2014    Priority: High  . Seborrhea 09/29/2015  . Localization-related symptomatic epilepsy and epileptic syndromes with complex partial seizures, not intractable, with status epilepticus (HCC) 03/02/2015  . Vitamin D deficiency 01/12/2015     Outpatient Prescriptions Prior to Visit  Medication Sig Dispense Refill  . DULoxetine (CYMBALTA) 30 MG capsule Take 1 capsule (30 mg total) by mouth daily. 30 capsule 5  . lamoTRIgine (LAMICTAL) 100 MG tablet Take 1 tablet twice a day 60 tablet 11  . traZODone (DESYREL) 50 MG tablet Take 2 tablets at night 60 tablet 5   No facility-administered medications prior to visit.    ROS Review of Systems  Constitutional: Negative for fever and chills.  Eyes: Negative for visual disturbance.  Respiratory: Negative for shortness of breath.   Cardiovascular: Negative for chest pain.  Gastrointestinal: Negative for abdominal pain and blood in stool.  Musculoskeletal: Negative for back pain and arthralgias.  Skin: Positive for rash.  Allergic/Immunologic: Negative for immunocompromised state.  Neurological: Negative for seizures.  Hematological: Negative for adenopathy. Does not bruise/bleed easily.  Psychiatric/Behavioral: Negative for suicidal ideas and dysphoric mood.     Objective:  BP 128/85 mmHg  Pulse 103  Temp(Src) 98.8 F (37.1 C) (Oral)  Resp 16  Ht 5\' 8"  (1.727 m)  Wt 372 lb (168.738 kg)  BMI 56.58 kg/m2  SpO2 99%  LMP 09/19/2015  BP/Weight 09/29/2015 08/17/2015 05/14/2015  Systolic BP 128 108 118  Diastolic BP 85 74 84  Wt. (Lbs) 372 360 362  BMI 56.58 56.37 56.68    Physical Exam  Constitutional: She is oriented to person, place, and time. She appears well-developed and well-nourished. No distress.  Obese   HENT:  Head: Normocephalic and atraumatic.  Cardiovascular: Normal rate, regular rhythm, normal heart sounds and intact distal pulses.   Pulmonary/Chest: Effort normal and breath sounds normal.  Musculoskeletal: She exhibits no edema.  Neurological: She is alert and oriented to person, place, and time.  Skin: Skin is warm and dry. No rash noted.  Excessive flakes in scalp, eyebrows, ears, around nose  Psychiatric: She has a normal mood and affect.     Assessment & Plan:   Makayla Livingston was seen today for rash.  Diagnoses and all orders for this visit:  Seborrhea -     HgB A1c -     ketoconazole (NIZORAL) 2 % shampoo; Apply 1 application topically 2 (two) times a week. Apply to scalp -     ketoconazole (NIZORAL) 2 % cream; Apply 1 application topically 2 (two) times daily. Apply to affected areas of face   No orders of the defined types were placed in this encounter.    Follow-up: No Follow-up on file.   Dessa PhiJosalyn Hira Trent MD

## 2015-09-29 NOTE — Telephone Encounter (Signed)
Left msg on patients vm that letter was ready for p/u. Left at front desk.

## 2015-09-29 NOTE — Progress Notes (Signed)
Rash on face on and off, itchy  Pain scale # 0 No tobacco user  No suicidal thoughts in the past two weeks

## 2015-09-29 NOTE — Patient Instructions (Addendum)
Makayla Livingston was seen today for rash.  Diagnoses and all orders for this visit:  Seborrhea -     HgB A1c -     ketoconazole (NIZORAL) 2 % shampoo; Apply 1 application topically 2 (two) times a week. Apply to scalp -     ketoconazole (NIZORAL) 2 % cream; Apply 1 application topically 2 (two) times daily. Apply to affected areas of face   Call in 4 weeks if not better and I will place a derm referral F/u in 3 months   Dr. Armen PickupFunches  Seborrheic Dermatitis Seborrheic dermatitis involves pink or red skin with greasy, flaky scales. It usually occurs on the scalp, and it is often called dandruff. This condition may also affect the eyebrows, nose, ears, chest, and the bearded area of men's faces. It often occurs where skin has more oil (sebaceous) glands. It may come and go for no known reason, and it is often long-lasting (chronic). CAUSES The cause is not known. RISK FACTORS This condition is more like to develop in:  People who are stressed or tired.  People who have skin conditions, such as acne.  People who have certain conditions, such as:  HIV (human immunodeficiency virus).  AIDS (acquired immunodeficiency syndrome).  Parkinson disease.  An eating disorder.  Stroke.  Depression.  Epilepsy.  Alcoholism.  People who live in places that have extreme weather.  People who have a family history of seborrheic dermatitis.  People who use skin creams that are made with alcohol.  People who are 2230-21 years old.  People who take certain medicines. SYMPTOMS Symptoms of this condition include:  Thick scales on the scalp.  Redness on the face or in the armpits.  Skin that is flaky. The flakes may be white or yellow.  Skin that seems oily or dry but is not helped with moisturizers.  Itching or burning in the affected areas. DIAGNOSIS This condition is diagnosed with a medical history and physical exam. A sample of your skin may be tested (skin biopsy). You may need to see a  skin specialist (dermatologist). TREATMENT There is no cure for this condition, but treatment can help to manage the symptoms. Treatment may include:  Cortisone (steroid) ointments, creams, and lotions.  Over-the-counter or prescription shampoos. HOME CARE INSTRUCTIONS  Apply over-the-counter and prescription medicines only as told by your health care provider.  Keep all follow-up visits as told by your health care provider. This is important.  Try to reduce your stress, such as with yoga or mediation. If you need help to reduce stress, ask your health care provider.  Shower or bathe as told by your health care provider.  Use any medicated shampoos as told by your health care provider. SEEK MEDICAL CARE IF:  Your symptoms do not improve with treatment.  Your symptoms get worse.  You have new symptoms.   This information is not intended to replace advice given to you by your health care provider. Make sure you discuss any questions you have with your health care provider.   Document Released: 07/04/2005 Document Revised: 03/25/2015 Document Reviewed: 11/19/2014 Elsevier Interactive Patient Education [employer]2016 Elsevier Inc.

## 2015-09-29 NOTE — Assessment & Plan Note (Signed)
A; seborrhea P: Treat with nizoral shampoo twice weekly  to scalp nizoral cream BID to face

## 2015-09-29 NOTE — Telephone Encounter (Signed)
PT called in regards to a letter that was being worked on for her/Dawn CB# [phone]

## 2015-09-30 ENCOUNTER — Encounter: Payer: Self-pay | Admitting: Clinical

## 2015-09-30 NOTE — Progress Notes (Signed)
[facility] 09/29/2015 05/14/2015 02/10/2015  Decreased Interest 2 3 0  Down, Depressed, Hopeless 3 3 0  PHQ - 2 Score 5 6 0  Altered sleeping 3 2 -  Tired, decreased energy 3 3 -  Change in appetite 3 3 -  Feeling bad or failure about yourself  3 3 -  Trouble concentrating 1 3 -  Moving slowly or fidgety/restless 3 1 -  Suicidal thoughts 3 3 -  PHQ-9 Score 24 24 -  09-29-15: PHQ9- 20(not 24) 2,3,2,2,3,3,3,0,2 *no plans to end life in last 2 weeks  GAD 7 : Generalized Anxiety Score 09/29/2015 05/15/2015  Nervous, Anxious, on Edge 2 2  Control/stop worrying 3 3  Worry too much - different things 3 3  Trouble relaxing 3 2  Restless 1 1  Easily annoyed or irritable 3 3  Afraid - awful might happen 3 2  Total GAD 7 Score 18 16

## 2015-11-24 ENCOUNTER — Ambulatory Visit: Payer: Self-pay | Admitting: Neurology

## 2015-12-15 ENCOUNTER — Ambulatory Visit: Payer: Medicaid Other | Attending: Family Medicine | Admitting: Family Medicine

## 2015-12-15 ENCOUNTER — Encounter: Payer: Self-pay | Admitting: Family Medicine

## 2015-12-15 VITALS — BP 137/82 | HR 108 | Temp 98.3°F | Resp 16 | Ht 67.0 in | Wt 370.0 lb

## 2015-12-15 DIAGNOSIS — L309 Dermatitis, unspecified: Secondary | ICD-10-CM | POA: Diagnosis not present

## 2015-12-15 DIAGNOSIS — F321 Major depressive disorder, single episode, moderate: Secondary | ICD-10-CM | POA: Diagnosis not present

## 2015-12-15 DIAGNOSIS — R41840 Attention and concentration deficit: Secondary | ICD-10-CM

## 2015-12-15 DIAGNOSIS — L219 Seborrheic dermatitis, unspecified: Secondary | ICD-10-CM | POA: Diagnosis not present

## 2015-12-15 DIAGNOSIS — Z79899 Other long term (current) drug therapy: Secondary | ICD-10-CM | POA: Diagnosis not present

## 2015-12-15 DIAGNOSIS — R0683 Snoring: Secondary | ICD-10-CM | POA: Diagnosis not present

## 2015-12-15 MED ORDER — TRIAMCINOLONE ACETONIDE 0.5 % EX OINT: 1.0000 "application " | TOPICAL_OINTMENT | Freq: Two times a day (BID) | CUTANEOUS | Status: DC

## 2015-12-15 MED ORDER — KETOCONAZOLE 2 % EX CREA: 1.0000 "application " | TOPICAL_CREAM | Freq: Two times a day (BID) | CUTANEOUS | Status: DC

## 2015-12-15 MED ORDER — DULOXETINE HCL 60 MG PO CPEP: 60.0000 mg | ORAL_CAPSULE | Freq: Every day | ORAL | Status: DC

## 2015-12-15 MED ORDER — KETOCONAZOLE 2 % EX SHAM: 1.0000 "application " | MEDICATED_SHAMPOO | CUTANEOUS | Status: DC

## 2015-12-15 NOTE — Assessment & Plan Note (Signed)
Depression  Continue counseling Increase cymbalta to 60 mg daily Continue trazodone 100 mg nightly

## 2015-12-15 NOTE — Patient Instructions (Addendum)
Makayla Livingston was seen today for hair/scalp problem.  Diagnoses and all orders for this visit:  Seborrhea -     Discontinue: ketoconazole (NIZORAL) 2 % shampoo; Apply 1 application topically 2 (two) times a week. Apply to scalp -     Discontinue: ketoconazole (NIZORAL) 2 % cream; Apply 1 application topically 2 (two) times daily. Apply to affected areas of face -     ketoconazole (NIZORAL) 2 % shampoo; Apply 1 application topically 2 (two) times a week. Apply to scalp -     ketoconazole (NIZORAL) 2 % cream; Apply 1 application topically 2 (two) times daily. Apply to affected areas of face  Major depressive disorder, single episode, moderate (HCC) -     Discontinue: DULoxetine (CYMBALTA) 60 MG capsule; Take 1 capsule (60 mg total) by mouth daily. -     DULoxetine (CYMBALTA) 60 MG capsule; Take 1 capsule (60 mg total) by mouth daily.  Difficulty concentrating -     Split night study; Future  Snoring -     Split night study; Future  Dermatitis -     triamcinolone ointment (KENALOG) 0.5 %; Apply 1 application topically 2 (two) times daily.    F/u in 4-6 weeks for depression and trouble concentrating  Dr. Casimer LeekFunvches

## 2015-12-15 NOTE — Progress Notes (Signed)
F/U dry scalp, rash on face  Better since last visit  No tobacco user  No suicidal thoughts in the past two weeks

## 2015-12-15 NOTE — Progress Notes (Signed)
Subjective:  Patient ID: Makayla Livingston, female    DOB: 08-04-1995  Age: 20 y.o. MRN: [id number]  CC: Hair/Scalp Problem   HPI Makayla Livingston presents for seborrhea:  1. Seborrhea: on face and scalp. Improved with nizoral cream and shampoo. She request a refill.  2. Patches: on elbows and knees. Light. Sometimes itchy. No treatment.  3. Depression: taking trazodone and cymbalta. Has trouble sleeping, snores, eats late at night often. Has trouble concentrating since 2014 that has recently worsened. Feels that depression is not well controlled. She does see a Veterinary surgeon.   Social History  Substance Use Topics  . Smoking status: Never Smoker   . Smokeless tobacco: Never Used  . Alcohol Use: No    Outpatient Prescriptions Prior to Visit  Medication Sig Dispense Refill  . DULoxetine (CYMBALTA) 30 MG capsule Take 1 capsule (30 mg total) by mouth daily. 30 capsule 5  . ketoconazole (NIZORAL) 2 % cream Apply 1 application topically 2 (two) times daily. Apply to affected areas of face 60 g 5  . lamoTRIgine (LAMICTAL) 100 MG tablet Take 1 tablet twice a day 60 tablet 11  . traZODone (DESYREL) 50 MG tablet Take 2 tablets at night 60 tablet 5  . ketoconazole (NIZORAL) 2 % shampoo Apply 1 application topically 2 (two) times a week. Apply to scalp 120 mL 0   No facility-administered medications prior to visit.    ROS Review of Systems  Constitutional: Negative for fever and chills.  Eyes: Negative for visual disturbance.  Respiratory: Negative for shortness of breath.   Cardiovascular: Negative for chest pain.  Gastrointestinal: Negative for abdominal pain and blood in stool.  Musculoskeletal: Negative for back pain and arthralgias.  Skin: Positive for rash.  Allergic/Immunologic: Negative for immunocompromised state.  Hematological: Negative for adenopathy. Does not bruise/bleed easily.  Psychiatric/Behavioral: Positive for sleep disturbance and dysphoric mood. Negative for suicidal  ideas. The patient is nervous/anxious.     Objective:  BP 137/82 mmHg  Pulse 108  Temp(Src) 98.3 F (36.8 C) (Oral)  Resp 16  Ht  (1.702 m)  Wt 370 lb (167.831 kg)  BMI 57.94 kg/m2  SpO2 97%  LMP 11/21/2015  BP/Weight 12/15/2015 09/29/2015 08/17/2015  Systolic BP 137 128 108  Diastolic BP 82 85 74  Wt. (Lbs) 370 372 360  BMI 57.94 56.58 56.37   Physical Exam  Constitutional: She is oriented to person, place, and time. She appears well-developed and well-nourished. No distress.  Obese   HENT:  Head: Normocephalic and atraumatic.  Cardiovascular: Normal rate, regular rhythm, normal heart sounds and intact distal pulses.   Pulmonary/Chest: Effort normal and breath sounds normal.  Musculoskeletal: She exhibits no edema.  Neurological: She is alert and oriented to person, place, and time.  Skin: Skin is warm and dry. No rash noted.  Dry patches on flexor surface of knees and elbows.  No flakes on face and scalp   Psychiatric: She has a normal mood and affect.    Depression screen [facility] 2/9 12/15/2015 09/29/2015 05/14/2015  Decreased Interest Down, Depressed, Hopeless PHQ - 2 Score Altered sleeping Tired, decreased energy Change in appetite Feeling bad or failure about yourself  Trouble concentrating Moving slowly or fidgety/restless Suicidal thoughts PHQ-9 Score 24 24 24  GAD 7 : Generalized Anxiety Score 12/15/2015 09/29/2015 05/15/2015  Nervous, Anxious, on Edge 3 2 2   Control/stop worrying 3 3 3   Worry too much - different things 3 3 3   Trouble relaxing 2 3 2   Restless 1 1 1   Easily annoyed or irritable 1 3 3   Afraid - awful might happen 3 3 2   Total GAD 7 Score 16 18 16      Assessment & Plan:  Makayla Livingston was seen today for hair/scalp problem.  Diagnoses and all orders for this visit:  Seborrhea -     Discontinue: ketoconazole (NIZORAL) 2 % shampoo; Apply 1 application topically 2 (two)  times a week. Apply to scalp -     Discontinue: ketoconazole (NIZORAL) 2 % cream; Apply 1 application topically 2 (two) times daily. Apply to affected areas of face -     ketoconazole (NIZORAL) 2 % shampoo; Apply 1 application topically 2 (two) times a week. Apply to scalp -     ketoconazole (NIZORAL) 2 % cream; Apply 1 application topically 2 (two) times daily. Apply to affected areas of face  Major depressive disorder, single episode, moderate (HCC) -     Discontinue: DULoxetine (CYMBALTA) 60 MG capsule; Take 1 capsule (60 mg total) by mouth daily. -     DULoxetine (CYMBALTA) 60 MG capsule; Take 1 capsule (60 mg total) by mouth daily.  Difficulty concentrating -     Split night study; Future  Snoring -     Split night study; Future  Dermatitis -     triamcinolone ointment (KENALOG) 0.5 %; Apply 1 application topically 2 (two) times daily.   Meds ordered this encounter  Medications  . ketoconazole (NIZORAL) 2 % shampoo    Sig: Apply 1 application topically 2 (two) times a week. Apply to scalp    Dispense:  120 mL    Refill:  11    Follow-up: No Follow-up on file.   Dessa PhiJosalyn Tymar Polyak MD

## 2015-12-21 ENCOUNTER — Other Ambulatory Visit: Payer: Self-pay | Admitting: Neurology

## 2015-12-21 NOTE — Telephone Encounter (Signed)
Lamictal refill requested. Per last office note- patient to remain on medication. Refill approved and sent to patient's pharmacy.   

## 2015-12-23 ENCOUNTER — Emergency Department (HOSPITAL_COMMUNITY)
Admission: EM | Admit: 2015-12-23 | Discharge: 2015-12-23 | Disposition: A | Discharge status: Home / Self Care | Payer: Medicaid Other | Attending: Emergency Medicine | Admitting: Emergency Medicine

## 2015-12-23 ENCOUNTER — Encounter (HOSPITAL_COMMUNITY): Payer: Self-pay | Admitting: Emergency Medicine

## 2015-12-23 DIAGNOSIS — F418 Other specified anxiety disorders: Secondary | ICD-10-CM | POA: Insufficient documentation

## 2015-12-23 DIAGNOSIS — Z79899 Other long term (current) drug therapy: Secondary | ICD-10-CM | POA: Diagnosis not present

## 2015-12-23 DIAGNOSIS — M5431 Sciatica, right side: Secondary | ICD-10-CM | POA: Insufficient documentation

## 2015-12-23 DIAGNOSIS — Z791 Long term (current) use of non-steroidal anti-inflammatories (NSAID): Secondary | ICD-10-CM | POA: Diagnosis not present

## 2015-12-23 DIAGNOSIS — M545 Low back pain: Secondary | ICD-10-CM | POA: Diagnosis present

## 2015-12-23 HISTORY — DX: Anxiety disorder, unspecified: F41.9

## 2015-12-23 HISTORY — DX: Unspecified convulsions: R56.9

## 2015-12-23 MED ORDER — DICLOFENAC SODIUM 50 MG PO TBEC: 50.0000 mg | DELAYED_RELEASE_TABLET | Freq: Two times a day (BID) | ORAL | Status: DC

## 2015-12-23 MED ORDER — CYCLOBENZAPRINE HCL 10 MG PO TABS
10.0000 mg | ORAL_TABLET | Freq: Once | ORAL | Status: AC
  Administered 2015-12-23: 10 mg via ORAL
  Filled 2015-12-23: qty 1

## 2015-12-23 MED ORDER — CYCLOBENZAPRINE HCL 10 MG PO TABS: 10.0000 mg | ORAL_TABLET | Freq: Two times a day (BID) | ORAL | Status: DC | PRN

## 2015-12-23 NOTE — ED Notes (Signed)
Pt arrives via [facility] from home with c/o low back pain for the last 4 days after sleeping. Pt denies recent injury/falls/ bowel/bladder incontinence. Pt ambulatory in triage.

## 2015-12-23 NOTE — ED Notes (Signed)
Pt stable, ambulatory, states understanding of discharge instructions 

## 2015-12-23 NOTE — Discharge Instructions (Signed)
Follow up with Dr. Lajoyce Corners if symptoms persist  Sciatica Sciatica is pain, weakness, numbness, or tingling along the path of the sciatic nerve. The nerve starts in the lower back and runs down the back of each leg. The nerve controls the muscles in the lower leg and in the back of the knee, while also providing sensation to the back of the thigh, lower leg, and the sole of your foot. Sciatica is a symptom of another medical condition. For instance, nerve damage or certain conditions, such as a herniated disk or bone spur on the spine, pinch or put pressure on the sciatic nerve. This causes the pain, weakness, or other sensations normally associated with sciatica. Generally, sciatica only affects one side of the body. CAUSES   Herniated or slipped disc.  Degenerative disk disease.  A pain disorder involving the narrow muscle in the buttocks (piriformis syndrome).  Pelvic injury or fracture.  Pregnancy.  Tumor (rare). SYMPTOMS  Symptoms can vary from mild to very severe. The symptoms usually travel from the low back to the buttocks and down the back of the leg. Symptoms can include:  Mild tingling or dull aches in the lower back, leg, or hip.  Numbness in the back of the calf or sole of the foot.  Burning sensations in the lower back, leg, or hip.  Sharp pains in the lower back, leg, or hip.  Leg weakness.  Severe back pain inhibiting movement. These symptoms may get worse with coughing, sneezing, laughing, or prolonged sitting or standing. Also, being overweight may worsen symptoms. DIAGNOSIS  Your caregiver will perform a physical exam to look for common symptoms of sciatica. He or she may ask you to do certain movements or activities that would trigger sciatic nerve pain. Other tests may be performed to find the cause of the sciatica. These may include:  Blood tests.  X-rays.  Imaging tests, such as an MRI or CT scan. TREATMENT  Treatment is directed at the cause of the sciatic  pain. Sometimes, treatment is not necessary and the pain and discomfort goes away on its own. If treatment is needed, your caregiver may suggest:  Over-the-counter medicines to relieve pain.  Prescription medicines, such as anti-inflammatory medicine, muscle relaxants, or narcotics.  Applying heat or ice to the painful area.  Steroid injections to lessen pain, irritation, and inflammation around the nerve.  Reducing activity during periods of pain.  Exercising and stretching to strengthen your abdomen and improve flexibility of your spine. Your caregiver may suggest losing weight if the extra weight makes the back pain worse.  Physical therapy.  Surgery to eliminate what is pressing or pinching the nerve, such as a bone spur or part of a herniated disk. HOME CARE INSTRUCTIONS   Only take over-the-counter or prescription medicines for pain or discomfort as directed by your caregiver.  Apply ice to the affected area for 20 minutes, 3-4 times a day for the first 48-72 hours. Then try heat in the same way.  Exercise, stretch, or perform your usual activities if these do not aggravate your pain.  Attend physical therapy sessions as directed by your caregiver.  Keep all follow-up appointments as directed by your caregiver.  Do not wear high heels or shoes that do not provide proper support.  Check your mattress to see if it is too soft. A firm mattress may lessen your pain and discomfort. SEEK IMMEDIATE MEDICAL CARE IF:   You lose control of your bowel or bladder (incontinence).  You  have increasing weakness in the lower back, pelvis, buttocks, or legs.  You have redness or swelling of your back.  You have a burning sensation when you urinate.  You have pain that gets worse when you lie down or awakens you at night.  Your pain is worse than you have experienced in the past.  Your pain is lasting longer than 4 weeks.  You are suddenly losing weight without reason. MAKE SURE  YOU:  Understand these instructions.  Will watch your condition.  Will get help right away if you are not doing well or get worse.   This information is not intended to replace advice given to you by your health care provider. Make sure you discuss any questions you have with your health care provider.   Document Released: 06/28/2001 Document Revised: 03/25/2015 Document Reviewed: 11/13/2011 Elsevier Interactive Patient Education [employer] Elsevier Inc.

## 2015-12-23 NOTE — ED Provider Notes (Signed)
CSN: [id number]     Arrival date & time 12/23/15  1321 History  By signing my name below, I, Makayla Livingston, attest that this documentation has been prepared under the direction and in the presence of [facility], [place].   Electronically Signed: Rosario Livingston, ED Scribe. 12/23/2015. 2:30 PM.    Chief Complaint  Patient presents with  . Back Pain   Patient is a 20 y.o. female presenting with back pain. The history is provided by the patient. No language interpreter was used.  Back Pain Location:  Lumbar spine Quality:  Shooting Radiates to:  L thigh and R thigh Pain severity:  Severe Pain is:  Same all the time Onset quality:  Sudden Duration:  4 days Timing:  Constant Progression:  Unchanged Chronicity:  New Context: not recent injury   Relieved by:  Nothing Worsened by:  Ambulation Ineffective treatments: Icy Hot patches. Associated symptoms: no dysuria, no numbness and no weakness    HPI Comments: Makayla Livingston is a 20 y.o. obese female who presents to the Emergency Department complaining of sudden onset, unchanged, 10/10 lower back pain that began 4 days ago. Pt reports that woke up one morning with the pain, and denies a hx of similar symptoms or problems. States she thinks she slept wrong. No known injury to the area. No OTC medications taken PTA, but has tried using [employer] patches with no relief of her sx. Pt reports that it radiates into her bilateral thighs upon movement. LKMP was 12/21/2015. Pt has no pertinent PSHx. She denies numbness, tingling, weakness, frequency, urgency, or dysuria.   Past Medical History  Diagnosis Date  . Depression   . Anxiety   . Seizures (HCC)    History reviewed. No pertinent past surgical history. Family History  Problem Relation Age of Onset  . Hypertension Mother   . Cancer Maternal Grandmother     pancreatic cancer   . Cancer Maternal Grandfather     colonc cancer   Social History  Substance Use Topics  . Smoking  status: Never Smoker   . Smokeless tobacco: Never Used  . Alcohol Use: No   OB History    No data available     Review of Systems  Genitourinary: Negative for dysuria, urgency and frequency.  Musculoskeletal: Positive for back pain (lower).  Neurological: Negative for weakness and numbness.       -tingling  All other systems reviewed and are negative.  Allergies  Penicillins and Pollen extract  Home Medications   Prior to Admission medications   Medication Sig Start Date End Date Taking? Authorizing Provider  cyclobenzaprine (FLEXERIL) 10 MG tablet Take 1 tablet (10 mg total) by mouth 2 (two) times daily as needed for muscle spasms. 12/23/15   [name], NP  diclofenac (VOLTAREN) 50 MG EC tablet Take 1 tablet (50 mg total) by mouth 2 (two) times daily. 12/23/15   [name], NP  DULoxetine (CYMBALTA) 60 MG capsule Take 1 capsule (60 mg total) by mouth daily. 12/15/15   [name], MD  ketoconazole (NIZORAL) 2 % cream Apply 1 application topically 2 (two) times daily. Apply to affected areas of face 12/15/15   [name], MD  ketoconazole (NIZORAL) 2 % shampoo Apply 1 application topically 2 (two) times a week. Apply to scalp 12/15/15   [name], MD  lamoTRIgine (LAMICTAL) 100 MG tablet TAKE ONE TABLET BY MOUTH TWICE DAILY 12/21/15   [name], MD  traZODone [facility])  50 MG tablet Take 2 tablets at night 08/17/15   Van ClinesKaren M Aquino, MD  triamcinolone ointment (KENALOG) 0.5 % Apply 1 application topically 2 (two) times daily. 12/15/15   [name], MD   BP 128/71 mmHg  Pulse 88  Temp(Src) 97.7 F (36.5 C) (Oral)  Resp 16  SpO2 96%  LMP 12/21/2015   Physical Exam  Constitutional: She is oriented to person, place, and time. No distress.  Obese  HENT:  Head: Normocephalic and atraumatic.  Right Ear: Tympanic membrane normal.  Left Ear: Tympanic membrane normal.  Nose: Nose normal.  Mouth/Throat: Uvula is midline, oropharynx is clear and moist and mucous  membranes are normal.  Eyes: Conjunctivae and EOM are normal.  Neck: Normal range of motion. Neck supple.  Cardiovascular: Normal rate and regular rhythm.   No murmur heard. Pulses:      Radial pulses are 2+ on the right side, and 2+ on the left side.  Pulmonary/Chest: Effort normal and breath sounds normal. No respiratory distress.  Abdominal: Soft. There is no tenderness.  Musculoskeletal: Normal range of motion.       Lumbar back: She exhibits tenderness, pain and spasm. She exhibits normal pulse.  No CVA tenderness. No tenderness over C or T-spine. TTP bilateral lumbar area.   Neurological: She is alert and oriented to person, place, and time. She has normal strength. No cranial nerve deficit or sensory deficit. Gait normal.  Reflex Scores:      Bicep reflexes are 2+ on the right side and 2+ on the left side.      Brachioradialis reflexes are 2+ on the right side and 2+ on the left side.      Patellar reflexes are 2+ on the right side and 2+ on the left side. 5/5 bilateral arm strength.  Skin: Skin is warm and dry.  Psychiatric: She has a normal mood and affect. Her behavior is normal.  Nursing note and vitals reviewed.  ED Course  Procedures (including critical care time)  DIAGNOSTIC STUDIES: Oxygen Saturation is 97% on RA, normal by my interpretation.   COORDINATION OF CARE: 2:26 PM-Discussed next steps with pt including a prescription for muscle relaxants. Pt verbalized understanding and is agreeable with the plan.   MDM   Final diagnoses:  Sciatica, right   Patient with back pain.  No neurological deficits noted.  Patient can walk but states is painful.  No loss of bowel or bladder control.  No concern for cauda equina.  No fever, night sweats, weight loss, h/o cancer, IVDU.  RICE protocol and muscle relaxants, NSAIDS as needed. Discussed with patient.   I personally performed the services described in this documentation, which was scribed in my presence. The recorded  information has been reviewed and is accurate.   [name], NP 12/23/15 1719  [name], MD 12/24/15 [phone]

## 2016-02-05 ENCOUNTER — Ambulatory Visit (INDEPENDENT_AMBULATORY_CARE_PROVIDER_SITE_OTHER): Payer: Medicaid Other | Admitting: Neurology

## 2016-02-05 ENCOUNTER — Encounter: Payer: Self-pay | Admitting: Neurology

## 2016-02-05 VITALS — BP 138/78 | HR 108 | Ht 67.5 in | Wt 363.0 lb

## 2016-02-05 DIAGNOSIS — G4700 Insomnia, unspecified: Secondary | ICD-10-CM | POA: Diagnosis not present

## 2016-02-05 DIAGNOSIS — G40201 Localization-related (focal) (partial) symptomatic epilepsy and epileptic syndromes with complex partial seizures, not intractable, with status epilepticus: Secondary | ICD-10-CM

## 2016-02-05 DIAGNOSIS — F321 Major depressive disorder, single episode, moderate: Secondary | ICD-10-CM | POA: Diagnosis not present

## 2016-02-05 MED ORDER — LAMOTRIGINE 100 MG PO TABS: ORAL_TABLET | ORAL | Status: DC

## 2016-02-05 MED ORDER — TRAZODONE HCL 100 MG PO TABS: 100.0000 mg | ORAL_TABLET | Freq: Every day | ORAL | Status: DC

## 2016-02-05 NOTE — Patient Instructions (Signed)
1. You look great! Continue all your medications 2. Follow-up in 6 months, call for any changes  Seizure Precautions: 1. If medication has been prescribed for you to prevent seizures, take it exactly as directed.  Do not stop taking the medicine without talking to your doctor first, even if you have not had a seizure in a long time.   2. Avoid activities in which a seizure would cause danger to yourself or to others.  Don't operate dangerous machinery, swim alone, or climb in high or dangerous places, such as on ladders, roofs, or girders.  Do not drive unless your doctor says you may.  3. If you have any warning that you may have a seizure, lay down in a safe place where you can't hurt yourself.    4.  No driving for 6 months from last seizure, [place].   Please refer to the following link on the [employer]ile[employer] of [employer]'s website for more information: http://www.epilepsyfoundation.org/answerplace/Social/driving/drivingu.cfm   5.  Maintain good sleep hygiene. Avoid alcohol.  6.  Notify your neurology if you are planning pregnancy or if you become pregnant.  7.  Contact your doctor if you have any problems that may be related to the medicine you are taking.  8.  Call 911 and bring the patient back to the ED if:        A.  The seizure lasts longer than 5 minutes.       B.  The patient doesn't awaken shortly after the seizure  C.  The patient has new problems such as difficulty seeing, speaking or moving  D.  The patient was injured during the seizure  E.  The patient has a temperature over 102 F (39C)  F.  The patient vomited and now is having trouble breathing

## 2016-02-05 NOTE — Progress Notes (Signed)
NEUROLOGY FOLLOW UP OFFICE NOTE  Makayla Livingston [id number]  HISTORY OF PRESENT ILLNESS: I had the pleasure of seeing Makayla Livingston in follow-up in the neurology clinic on 02/05/2016. The patient was last seen 6 months ago after she had new onset 7 convulsive seizures last 12/22/14. No further seizures since June 2016. She had emotional changes with Dilantin, and has switched to Lamictal, currently on  BID with no further seizures or seizure-like symptoms. She denies any side effects to Lamictal and reports mood is better. She continues to work with her therapist. She reports that she occasionally notices that she would write the same word down. She is starting to focus more. No staring/unresponsive episodes, no gaps in time, olfactory/gustatory hallucinations. Sleep is much better on higher dose Trazodone  qhs. She also takes Cymbalta for depression. She denies any headaches, dizziness, diplopia, dysarthria, dysphagia, focal numbness/tingling/weakness, neck/back pain, bowel/bladder dysfunction.   HPI: This is a pleasant 20 yo RH woman with morbid obesity, in her usual state of health until 12/22/14 when she left school early because she was feeling unwell. She fell asleep until midnight, and spoke briefly to her mother, who did not note any confusion. The next day she woke up reporting her tongue felt swollen, they went to the ER with lesions on both side of her tongue, noted to have buccal and tongue ulcerations. She was diagnosed with herpangina and discharged with Carafate. She went home and did her homework, but that evening kept asking repeatedly what her mother was saying. She told her mother she could hear but could not understand what she was saying. This was chalked up to her tongue being swollen. She went to bed at 10pm, then at 4am her mother heard a loud sound and saw her in the bedroom with her room in disarray, she kept standing up then falling down, spinning in circles, making sound  but mostly non-verbal. She did not recognize her mother and could not answer questions. When EMS arrived, she had 2 witnessed convulsions lasting 1 minute. Her mother reports she witnessed 3 more in the ER, all of the with note of head turn to the right per mother. ER notes indicate left gaze deviation. Records reviewed, she had a total of 7 convulsions that day until she was intubated for airway protection. I personally reviewed CT head without contrast (unable to fit in MRI), which showed slightly higher attenuation over the anterior frontal lobes, most compatible with beam hardening artifact, no acute changes noted. Her sedated EEG reported intermittent right-sided sharp wave activity and focal slowing, appears to have a frontal predominance with possible phase reversal at F4. She had a lumbar puncture with CSF WBC 2, RBC 3, protein 13, glucose 64, HSV PCR and CSF culture negative. UDS negative except for benzodiazepines (received Versed en route). She was started on Keppra, however after extubation, she had a "mini-breakdown" thinking she was in a mental institution and pulled her IV out, crying and being very angry. Her mother reports she kept going on about her room. She recall this and recalls texting a friend asking if she was in a mental hospital and crying. This was attributed to Keppra, which was discontinued. Her mother reports her mood totally changed and improved that evening. She was started on Vimpat, then switched to Dilantin due to cost.   She denies any side effects on Dilantin. She feels her memory worsened after the seizures, but that it is slowly improving. It is her first day  back to college today. She denies any olfactory/gustatory hallucinations, deja vu, rising epigastric sensation, focal numbness/tingling/weakness, myoclonic jerks. No focal weakness noted after the seizures. Her mother denies any staring/unresponsiveness, no gaps in time. She reports being sleep deprived the nights prior  to the seizures, no alcohol use. Her mother reports that since her hospital stay, she has had mood swings, quick to become upset and have an attitude. She was evaluated by Psychiatry during her hospitalization and was started on Cymbalta for depression and Trazodone for sleep. She will start seeing a therapist soon. She denies anyShe denies any recent infections, travels, or vaccinations.   Epilepsy Risk Factors: She had a normal birth and early development. There is no history of febrile convulsions, CNS infections such as meningitis/encephalitis, significant traumatic brain injury, neurosurgical procedures, or family history of seizures.  PAST MEDICAL HISTORY: Past Medical History  Diagnosis Date  . Depression   . Anxiety   . Seizures (HCC)     MEDICATIONS: Current Outpatient Prescriptions on File Prior to Visit  Medication Sig Dispense Refill  . DULoxetine (CYMBALTA) 30 MG capsule Take 1 capsule (30 mg total) by mouth daily. 30 capsule 1  . lamoTRIgine (LAMICTAL) 100mg  Take 1 tablet twice daily  120 tablet 3  .      . traZODone (DESYREL) 50 MG tablet Take 2 tablet (100 mg total) by mouth at bedtime. 30 tablet 1   No current facility-administered medications on file prior to visit.    ALLERGIES: Allergies  Allergen Reactions  . Penicillins Hives, Nausea And Vomiting and Rash  . Pollen Extract Other (See Comments)    Seasonal allergies    FAMILY HISTORY: Family History  Problem Relation Age of Onset  . Hypertension Mother   . Cancer Maternal Grandmother     pancreatic cancer   . Cancer Maternal Grandfather     colonc cancer    SOCIAL HISTORY: Social History   Social History  . Marital Status: Single    Spouse Name: N/A  . Number of Children: N/A  . Years of Education: N/A   Occupational History  . Student    Social History Main Topics  . Smoking status: Never Smoker   . Smokeless tobacco: Never Used  . Alcohol Use: No  . Drug Use: No  . Sexual Activity:  Not on file   Other Topics Concern  . Not on file   Social History Narrative    REVIEW OF SYSTEMS: Constitutional: No fevers, chills, or sweats, no generalized fatigue, change in appetite Eyes: No visual changes, double vision, eye pain Ear, nose and throat: No hearing loss, ear pain, nasal congestion, sore throat Cardiovascular: No chest pain, palpitations Respiratory:  No shortness of breath at rest or with exertion, wheezes GastrointestinaI: No nausea, vomiting, diarrhea, abdominal pain, fecal incontinence Genitourinary:  No dysuria, urinary retention or frequency Musculoskeletal:  No neck pain, back pain Integumentary: No rash, pruritus, skin lesions Neurological: as above Psychiatric: + depression,no insomnia, +anxiety Endocrine: No palpitations, fatigue, diaphoresis, mood swings, change in appetite, change in weight, increased thirst Hematologic/Lymphatic:  No anemia, purpura, petechiae. Allergic/Immunologic: no itchy/runny eyes, nasal congestion, recent allergic reactions, rashes  PHYSICAL EXAM: Filed Vitals:   02/05/16 1513  BP: 138/78  Pulse: 108   General: No acute distress Head:  Normocephalic/atraumatic Neck: supple, no paraspinal tenderness, full range of motion Heart:  Regular rate and rhythm Lungs:  Clear to auscultation bilaterally Back: No paraspinal tenderness Skin/Extremities: No rash, no edema Neurological Exam: alert and oriented  to person, place, and time. No aphasia or dysarthria. Fund of knowledge is appropriate.  Recent and remote memory are intact. 3/3 delayed recall.   Attention and concentration are normal.    Able to name objects and repeat phrases. Cranial nerves: Pupils equal, round, reactive to light. Extraocular movements intact with no nystagmus. Visual fields full. Facial sensation intact. No facial asymmetry. Tongue, uvula, palate midline.  Motor: Bulk and tone normal, muscle strength 5/5 throughout with no pronator drift.  Sensation to light  touch intact.  No extinction to double simultaneous stimulation.  Deep tendon reflexes 2+ throughout, toes downgoing.  Finger to nose testing intact.  Gait narrow-based and steady, able to tandem walk adequately.  Romberg negative.  IMPRESSION: This is a pleasant 20 yo RH woman with morbid obesity, no other clear epilepsy risk factors, with new onset seizures last June 2016. She was admitted 12/24/14 with a total of 7 convulsions that day, and prior to that she had woken up with tongue ulcerations and was noted to have some confusion. Her sedated EEG showed possible sharp waves over the right frontal region. MRI brain with and without contrast normal, repeat 1-hour wake and sleep EEG normal. She denies any further seizures since June 2016. She is tolerating Lamotrigine 100mg  BID with no side effects. Mood and sleep are better. Continue Trazodone 100mg  qhs. She is not driving and is aware of [place] driving laws to stop driving after a seizure, until 6 months seizure-free. She will follow-up in 6 months and knows to call our office for any changes.   Thank you for allowing me to participate in her care.  Please do not hesitate to call for any questions or concerns.  The duration of this appointment visit was 15 minutes of face-to-face time with the patient.  Greater than 50% of this time was spent in counseling, explanation of diagnosis, planning of further management, and coordination of care.   Patrcia DollyKaren Aquino, M.D.   CC: Dr. Armen PickupFunches

## 2016-02-08 ENCOUNTER — Ambulatory Visit (HOSPITAL_BASED_OUTPATIENT_CLINIC_OR_DEPARTMENT_OTHER): Payer: Medicaid Other | Attending: Family Medicine | Admitting: Internal Medicine

## 2016-02-08 VITALS — Ht 67.0 in | Wt 363.0 lb

## 2016-02-08 DIAGNOSIS — G4719 Other hypersomnia: Secondary | ICD-10-CM | POA: Insufficient documentation

## 2016-02-08 DIAGNOSIS — R0683 Snoring: Secondary | ICD-10-CM | POA: Diagnosis not present

## 2016-02-08 DIAGNOSIS — E669 Obesity, unspecified: Secondary | ICD-10-CM | POA: Diagnosis not present

## 2016-02-08 DIAGNOSIS — R5383 Other fatigue: Secondary | ICD-10-CM | POA: Diagnosis not present

## 2016-02-08 DIAGNOSIS — R41840 Attention and concentration deficit: Secondary | ICD-10-CM

## 2016-02-08 DIAGNOSIS — I493 Ventricular premature depolarization: Secondary | ICD-10-CM | POA: Insufficient documentation

## 2016-02-08 DIAGNOSIS — G4733 Obstructive sleep apnea (adult) (pediatric): Secondary | ICD-10-CM | POA: Diagnosis not present

## 2016-02-08 DIAGNOSIS — Z79899 Other long term (current) drug therapy: Secondary | ICD-10-CM | POA: Insufficient documentation

## 2016-02-20 ENCOUNTER — Other Ambulatory Visit: Payer: Self-pay | Admitting: Neurology

## 2016-02-20 DIAGNOSIS — R0683 Snoring: Secondary | ICD-10-CM

## 2016-02-20 DIAGNOSIS — R41840 Attention and concentration deficit: Secondary | ICD-10-CM

## 2016-02-20 NOTE — Procedures (Signed)
   Patient Name: Makayla Livingston, Makayla Livingston Date: 02/08/2016 Gender: Female D.O.B: 08/20/1995 Age (years): 19 Referring Provider: Gerilyn Nestle Funches Height (inches): 67 Interpreting Physician: Jetty Duhamel MD, ABSM Weight (lbs): 363 RPSGT: Ulyess Mort BMI: 57 MRN: [id number] Neck Size: 16.00 CLINICAL INFORMATION Sleep Study Type: NPSG Indication for sleep study: Excessive Daytime Sleepiness, Fatigue, Obesity, Snoring Epworth Sleepiness Score: 4  SLEEP STUDY TECHNIQUE As per the AASM Manual for the Scoring of Sleep and Associated Events v2.3 (April 2016) with a hypopnea requiring 4% desaturations. The channels recorded and monitored were frontal, central and occipital EEG, electrooculogram (EOG), submentalis EMG (chin), nasal and oral airflow, thoracic and abdominal wall motion, anterior tibialis EMG, snore microphone, electrocardiogram, and pulse oximetry.  MEDICATIONS Patient's medications include: charted for review Medications self-administered by patient during sleep study : TRAZODONE, LAMICTAL  SLEEP ARCHITECTURE The study was initiated at 11:09:51 PM and ended at 4:58:15 AM. Sleep onset time was 29.0 minutes and the sleep efficiency was 81.4%. The total sleep time was 283.4 minutes. Stage REM latency was N/A minutes. The patient spent 12.15% of the night in stage N1 sleep, 87.67% in stage N2 sleep, 0.18% in stage N3 and 0.00% in REM. Alpha intrusion was absent. Supine sleep was 20.44%.  RESPIRATORY PARAMETERS The overall apnea/hypopnea index (AHI) was 21.6 per hour. There were 10 total apneas, including 9 obstructive, 0 central and 1 mixed apneas. There were 92 hypopneas and 42 RERAs. The AHI during Stage REM sleep was N/A per hour. AHI while supine was 52.8 per hour. The mean oxygen saturation was 93.39%. The minimum SpO2 during sleep was 81.00%. Moderate snoring was noted during this study.  CARDIAC DATA The 2 lead EKG demonstrated sinus rhythm. The mean heart rate was  88.15 beats per minute. Other EKG findings include: PVCs.  LEG MOVEMENT DATA The total PLMS were 13 with a resulting PLMS index of 2.75. Associated arousal with leg movement index was 1.1 .  IMPRESSIONS - Moderate obstructive sleep apnea occurred during this study (AHI = 21.6/h). - Insufficient early events to meet protocol requirements for split Split CPAP titration - No significant central sleep apnea occurred during this study (CAI = 0.0/h). - Mild oxygen desaturation was noted during this study (Min O2 = 81.00%). - The patient snored with Moderate snoring volume. - EKG findings include PVCs. - Clinically significant periodic limb movements did not occur during sleep. No significant associated arousals.  DIAGNOSIS - Obstructive Sleep Apnea (327.23 [G47.33 ICD-10])  RECOMMENDATIONS - Therapeutic CPAP titration to determine optimal pressure required to alleviate sleep disordered breathing. - Positional therapy avoiding supine position during sleep. - Avoid alcohol, sedatives and other CNS depressants that may worsen sleep apnea and disrupt normal sleep architecture. - Sleep hygiene should be reviewed to assess factors that may improve sleep quality. - Weight management and regular exercise should be initiated or continued if appropriate.  [Electronically signed] 02/20/2016 12:53 PM  Jetty Duhamel MD, ABSM Diplomate, American Board of Sleep Medicine   NPI: [id number]  Waymon Budge Diplomate, American Board of Sleep Medicine  ELECTRONICALLY SIGNED ON:  02/20/2016, 12:50 PM [facility] PH: [phone]   FX: [phone] ACCREDITED BY THE AMERICAN ACADEMY OF SLEEP MEDICINE

## 2016-04-06 ENCOUNTER — Telehealth: Payer: Self-pay | Admitting: Neurology

## 2016-04-06 NOTE — Telephone Encounter (Signed)
Makayla Livingston 1995-10-23. Her # [phone] [id number]. She ran out and she is having an issue with her insurance and that it would be over $100.00. She is needing Duloxetine 60 mg and Trazodone 100mg  Lamotrigine 100 mg. She was needing samples and could she stop by and get them? Thank you

## 2016-04-06 NOTE — Telephone Encounter (Signed)
Contacted pt and she states she thinks there is something going on with her insurance. She is going to call them and let us know if she still needs assistance.

## 2016-04-07 MED FILL — ?TRAZODONE 100 MG TABLET: 100 | 30 days supply | Qty: 30 | Fill #0 | Status: TO

## 2016-04-07 MED FILL — lamoTRIgine 100 MG TABS: 100 | 30 days supply | Qty: 60 | Fill #0 | Status: TO

## 2016-04-07 MED FILL — DULoxetine HCL 60 MG CPEP: 60 | 30 days supply | Qty: 30 | Fill #0 | Status: TO

## 2016-05-02 ENCOUNTER — Ambulatory Visit: Payer: Medicaid Other | Attending: Family Medicine

## 2016-05-09 MED FILL — lamoTRIgine 100 MG TABS: 100 | 30 days supply | Qty: 60 | Fill #1 | Status: TO

## 2016-05-09 MED FILL — DULoxetine HCL 60 MG CPEP: 60 | 30 days supply | Qty: 30 | Fill #1 | Status: TO

## 2016-05-09 MED FILL — traZODone HCL 100 MG TABS: 100 | 30 days supply | Qty: 30 | Fill #1 | Status: TO

## 2016-06-22 ENCOUNTER — Telehealth: Payer: Self-pay | Admitting: Neurology

## 2016-06-22 NOTE — Telephone Encounter (Signed)
Makayla Livingston 12-30-1995 Her # [phone]. She has come off a couple of medications and has been feeling dizzy and not  Feeling well. Thank you

## 2016-06-22 NOTE — Telephone Encounter (Signed)
Contacted patient. She states she has been off her medications for about 2 weeks because she is not in the [place] area at moment to get medications from pharmacy. She is wondering if this could be why she is feeling this way. Per Dr. Karel JarvisAquino yes, and she recommends she not be off medications long.

## 2016-06-24 MED FILL — DULoxetine HCL 60 MG CPEP: 60 | 30 days supply | Qty: 30 | Fill #2 | Status: TO

## 2016-06-24 MED FILL — traZODone HCL 100 MG TABS: 100 | 30 days supply | Qty: 30 | Fill #2 | Status: TO

## 2016-06-24 MED FILL — ?LAMOTRIGINE 100 MG TABLET: 100 | 30 days supply | Qty: 60 | Fill #2 | Status: TO

## 2016-07-21 ENCOUNTER — Other Ambulatory Visit: Payer: Self-pay | Admitting: Family Medicine

## 2016-07-21 DIAGNOSIS — F321 Major depressive disorder, single episode, moderate: Secondary | ICD-10-CM

## 2016-07-21 MED FILL — ?LAMOTRIGINE 100 MG TABLET: 100 | 30 days supply | Qty: 60 | Fill #3 | Status: TO

## 2016-07-21 MED FILL — traZODone HCL 100 MG TABS: 100 | 30 days supply | Qty: 30 | Fill #3 | Status: TO

## 2016-07-21 MED FILL — DULoxetine HCL 60 MG CPEP: 60 | 30 days supply | Qty: 30 | Fill #0 | Status: TO

## 2016-08-04 ENCOUNTER — Ambulatory Visit: Payer: Self-pay

## 2016-08-05 ENCOUNTER — Ambulatory Visit: Payer: Self-pay | Attending: Family Medicine

## 2016-08-16 ENCOUNTER — Encounter: Payer: Self-pay | Admitting: Neurology

## 2016-08-16 ENCOUNTER — Ambulatory Visit (INDEPENDENT_AMBULATORY_CARE_PROVIDER_SITE_OTHER): Payer: Self-pay | Admitting: Neurology

## 2016-08-16 VITALS — BP 130/80 | HR 104 | Ht 67.0 in | Wt 364.3 lb

## 2016-08-16 DIAGNOSIS — G40201 Localization-related (focal) (partial) symptomatic epilepsy and epileptic syndromes with complex partial seizures, not intractable, with status epilepticus: Secondary | ICD-10-CM

## 2016-08-16 MED ORDER — LAMOTRIGINE 100 MG PO TABS: ORAL_TABLET | ORAL | 11 refills | Status: DC

## 2016-08-16 NOTE — Patient Instructions (Signed)
1. Continue Lamotrigine 100mg twice a day 2. Follow-up in 1 year, call for any changes  Seizure Precautions: 1. If medication has been prescribed for you to prevent seizures, take it exactly as directed.  Do not stop taking the medicine without talking to your doctor first, even if you have not had a seizure in a long time.   2. Avoid activities in which a seizure would cause danger to yourself or to others.  Don't operate dangerous machinery, swim alone, or climb in high or dangerous places, such as on ladders, roofs, or girders.  Do not drive unless your doctor says you may.  3. If you have any warning that you may have a seizure, lay down in a safe place where you can't hurt yourself.    4.  No driving for 6 months from last seizure, as per [place] state law.   Please refer to the following link on the Epilepsy Foundation of America's website for more information: http://www.epilepsyfoundation.org/answerplace/Social/driving/drivingu.cfm   5.  Maintain good sleep hygiene. Avoid alcohol.  6.  Notify your neurology if you are planning pregnancy or if you become pregnant.  7.  Contact your doctor if you have any problems that may be related to the medicine you are taking.  8.  Call 911 and bring the patient back to the ED if:        A.  The seizure lasts longer than 5 minutes.       B.  The patient doesn't awaken shortly after the seizure  C.  The patient has new problems such as difficulty seeing, speaking or moving  D.  The patient was injured during the seizure  E.  The patient has a temperature over 102 F (39C)  F.  The patient vomited and now is having trouble breathing         

## 2016-08-16 NOTE — Progress Notes (Signed)
NEUROLOGY FOLLOW UP OFFICE NOTE  Lodema HongBriani S Huffaker [id number]  HISTORY OF PRESENT ILLNESS: I had the pleasure of seeing Anthony SarBriani Fleece in follow-up in the neurology clinic on 08/16/3016. The patient was last seen 6 months ago after she had new onset 7 convulsive seizures last 12/22/14. No further seizures since June 2016. She had emotional changes with Dilantin, and has switched to Lamictal, currently on 100mg  BID with no further seizures or seizure-like symptoms, no side effects. She continues to deal with mood issues and was recently switched to Prozac. Sleep is good on Trazodone 100mg  qhs. She denies any headaches, dizziness, diplopia, dysarthria, dysphagia, focal numbness/tingling/weakness, neck/back pain, bowel/bladder dysfunction. No falls.   HPI: This is a pleasant 21 yo RH woman with morbid obesity, in her usual state of health until 12/22/14 when she left school early because she was feeling unwell. She fell asleep until midnight, and spoke briefly to her mother, who did not note any confusion. The next day she woke up reporting her tongue felt swollen, they went to the ER with lesions on both side of her tongue, noted to have buccal and tongue ulcerations. She was diagnosed with herpangina and discharged with Carafate. She went home and did her homework, but that evening kept asking repeatedly what her mother was saying. She told her mother she could hear but could not understand what she was saying. This was chalked up to her tongue being swollen. She went to bed at 10pm, then at 4am her mother heard a loud sound and saw her in the bedroom with her room in disarray, she kept standing up then falling down, spinning in circles, making sound but mostly non-verbal. She did not recognize her mother and could not answer questions. When EMS arrived, she had 2 witnessed convulsions lasting 1 minute. Her mother reports she witnessed 3 more in the ER, all of the with note of head turn to the right per mother. ER  notes indicate left gaze deviation. Records reviewed, she had a total of 7 convulsions that day until she was intubated for airway protection. I personally reviewed CT head without contrast (unable to fit in MRI), which showed slightly higher attenuation over the anterior frontal lobes, most compatible with beam hardening artifact, no acute changes noted. Her sedated EEG reported intermittent right-sided sharp wave activity and focal slowing, appears to have a frontal predominance with possible phase reversal at F4. She had a lumbar puncture with CSF WBC 2, RBC 3, protein 13, glucose 64, HSV PCR and CSF culture negative. UDS negative except for benzodiazepines (received Versed en route). She was started on Keppra, however after extubation, she had a "mini-breakdown" thinking she was in a mental institution and pulled her IV out, crying and being very angry. Her mother reports she kept going on about her room. She recall this and recalls texting a friend asking if she was in a mental hospital and crying. This was attributed to Keppra, which was discontinued. Her mother reports her mood totally changed and improved that evening. She was started on Vimpat, then switched to Dilantin due to cost.   She denies any side effects on Dilantin. She feels her memory worsened after the seizures, but that it is slowly improving. It is her first day back to college today. She denies any olfactory/gustatory hallucinations, deja vu, rising epigastric sensation, focal numbness/tingling/weakness, myoclonic jerks. No focal weakness noted after the seizures. Her mother denies any staring/unresponsiveness, no gaps in time. She reports being sleep deprived  the nights prior to the seizures, no alcohol use. Her mother reports that since her hospital stay, she has had mood swings, quick to become upset and have an attitude. She was evaluated by Psychiatry during her hospitalization and was started on Cymbalta for depression and Trazodone  for sleep. She will start seeing a therapist soon. She denies anyShe denies any recent infections, travels, or vaccinations.   Epilepsy Risk Factors: She had a normal birth and early development. There is no history of febrile convulsions, CNS infections such as meningitis/encephalitis, significant traumatic brain injury, neurosurgical procedures, or family history of seizures.  PAST MEDICAL HISTORY: Past Medical History:  Diagnosis Date  . Anxiety   . Depression   . Seizures [facility])     MEDICATIONS:  Outpatient Encounter Prescriptions as of 08/16/2016  Medication Sig Note  . DULoxetine (CYMBALTA) 60 MG capsule TAKE 1 CAPSULE BY MOUTH ONCE DAILY. 08/16/2016: 30mg   . ketoconazole (NIZORAL) 2 % cream Apply 1 application topically 2 (two) times daily. Apply to affected areas of face   . lamoTRIgine (LAMICTAL) 100 MG tablet Take 1 tablet twice a day   . traZODone (DESYREL) 100 MG tablet Take 1 tablet (100 mg total) by mouth at bedtime.   . triamcinolone ointment (KENALOG) 0.5 % Apply 1 application topically 2 (two) times daily.   . [DISCONTINUED] lamoTRIgine (LAMICTAL) 100 MG tablet Take 1 tablet twice a day   . ketoconazole (NIZORAL) 2 % shampoo Apply 1 application topically 2 (two) times a week. Apply to scalp (Patient not taking: Reported on 08/16/2016)   . traZODone (DESYREL) 50 MG tablet TAKE 2 TABLETS BY MOUTH AT NIGHT (Patient not taking: Reported on 08/16/2016)    No facility-administered encounter medications on file as of 08/16/2016.      ALLERGIES: Allergies  Allergen Reactions  . Penicillins Hives, Nausea And Vomiting and Rash  . Pollen Extract Other (See Comments)    Seasonal allergies    FAMILY HISTORY: Family History  Problem Relation Age of Onset  . Hypertension Mother   . Cancer Maternal Grandmother     pancreatic cancer   . Cancer Maternal Grandfather     colonc cancer    SOCIAL HISTORY: Social History   Social History  . Marital status: Single    Spouse  name: N/A  . Number of children: N/A  . Years of education: N/A   Occupational History  . Student    Social History Main Topics  . Smoking status: Never Smoker  . Smokeless tobacco: Never Used  . Alcohol use No  . Drug use: No  . Sexual activity: Not on file   Other Topics Concern  . Not on file   Social History Narrative  . No narrative on file    REVIEW OF SYSTEMS: Constitutional: No fevers, chills, or sweats, no generalized fatigue, change in appetite Eyes: No visual changes, double vision, eye pain Ear, nose and throat: No hearing loss, ear pain, nasal congestion, sore throat Cardiovascular: No chest pain, palpitations Respiratory:  No shortness of breath at rest or with exertion, wheezes GastrointestinaI: No nausea, vomiting, diarrhea, abdominal pain, fecal incontinence Genitourinary:  No dysuria, urinary retention or frequency Musculoskeletal:  No neck pain, back pain Integumentary: No rash, pruritus, skin lesions Neurological: as above Psychiatric: + depression,no insomnia, +anxiety Endocrine: No palpitations, fatigue, diaphoresis, mood swings, change in appetite, change in weight, increased thirst Hematologic/Lymphatic:  No anemia, purpura, petechiae. Allergic/Immunologic: no itchy/runny eyes, nasal congestion, recent allergic reactions, rashes  PHYSICAL EXAM: Vitals:  08/16/16 1110  BP: 130/80  Pulse: (!) 104   General: No acute distress Head:  Normocephalic/atraumatic Neck: supple, no paraspinal tenderness, full range of motion Heart:  Regular rate and rhythm Lungs:  Clear to auscultation bilaterally Back: No paraspinal tenderness Skin/Extremities: No rash, no edema Neurological Exam: alert and oriented to person, place, and time. No aphasia or dysarthria. Fund of knowledge is appropriate.  Recent and remote memory are intact.  Attention and concentration are normal.    Able to name objects and repeat phrases. Cranial nerves: Pupils equal, round, reactive  to light. Extraocular movements intact with no nystagmus. Visual fields full. Facial sensation intact. No facial asymmetry. Tongue, uvula, palate midline.  Motor: Bulk and tone normal, muscle strength 5/5 throughout with no pronator drift.  Sensation to light touch intact.  No extinction to double simultaneous stimulation.  Deep tendon reflexes 2+ throughout, toes downgoing.  Finger to nose testing intact.  Gait narrow-based and steady, able to tandem walk adequately.  Romberg negative.  IMPRESSION: This is a pleasant 21 yo RH woman with morbid obesity, no other clear epilepsy risk factors, with new onset seizures last June 2016. She was admitted 12/24/14 with a total of 7 convulsions that day, and prior to that she had woken up with tongue ulcerations and was noted to have some confusion. Her sedated EEG showed possible sharp waves over the right frontal region. MRI brain with and without contrast normal, repeat 1-hour wake and sleep EEG normal. She denies any further seizures since June 2016. She is tolerating Lamotrigine 100mg  BID with no side effects, refills sent. She is not driving and is aware of [place] driving laws to stop driving after a seizure, until 6 months seizure-free. She will follow-up in 1 year and knows to call our office for any changes.   Thank you for allowing me to participate in her care.  Please do not hesitate to call for any questions or concerns.  The duration of this appointment visit was 15 minutes of face-to-face time with the patient.  Greater than 50% of this time was spent in counseling, explanation of diagnosis, planning of further management, and coordination of care.   Patrcia Dolly, M.D.   CC: Dr. Armen Pickup

## 2016-08-19 ENCOUNTER — Encounter: Payer: Self-pay | Admitting: Neurology

## 2016-11-30 ENCOUNTER — Encounter: Payer: Self-pay | Admitting: Family Medicine

## 2017-03-13 IMAGING — CR DG CHEST 1V PORT
1 series · 1 of 1 positions shown · non-contrast
Comparison: 11/11/2007.

CLINICAL DATA: 18-year-old female status post seizure, intubated.
Initial encounter.

EXAM:
PORTABLE CHEST - 1 VIEW

[AP]
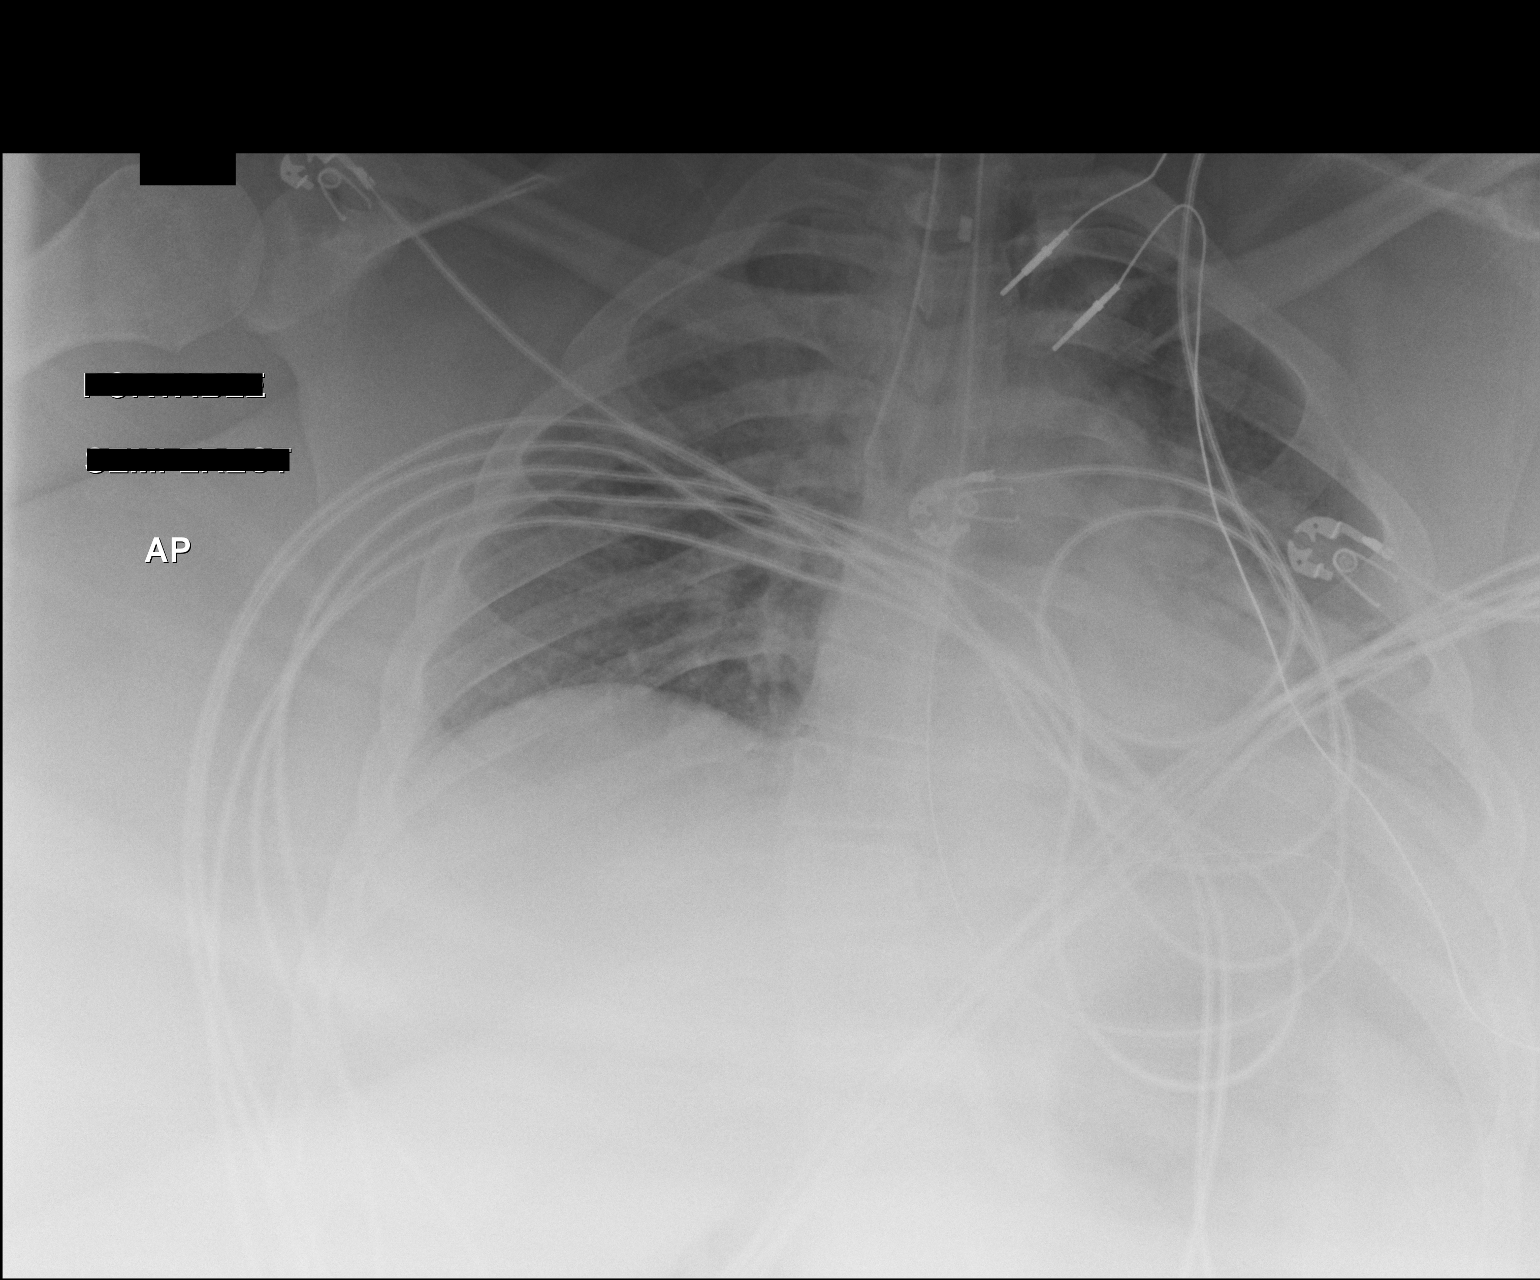

[1 of 1 positions shown; findings below may reference images not displayed]

FINDINGS: Portable AP semi upright view at 2655 hours. Large body habitus. The
tip of the endotracheal tube is just inside the right mainstem
bronchus. Enteric tube also in place, loops in the left upper
quadrant below the diaphragm.

Lower lung volumes with left greater than right perihilar opacity,
air bronchograms on the left. No pneumothorax. Scoliosis. No acute
osseous abnormality identified.
IMPRESSION: 1. Right mainstem bronchus intubation. Retract the ET tube 2 cm for
more optimal placement.
2. Enteric tube loops at the level of the gastric fundus.
3. Left greater than right pulmonary opacity might reflect a
combination of atelectasis and aspiration in this setting.
Study discussed by telephone with Dr. LYUSIEN KAPOOR on 12/24/2014 at

## 2017-03-14 IMAGING — CR DG CHEST 1V PORT
1 series · 1 of 1 positions shown · non-contrast
Comparison: 12/24/2014 at [DATE]

CLINICAL DATA: Central line placement

EXAM:
PORTABLE CHEST - 1 VIEW

[AP]
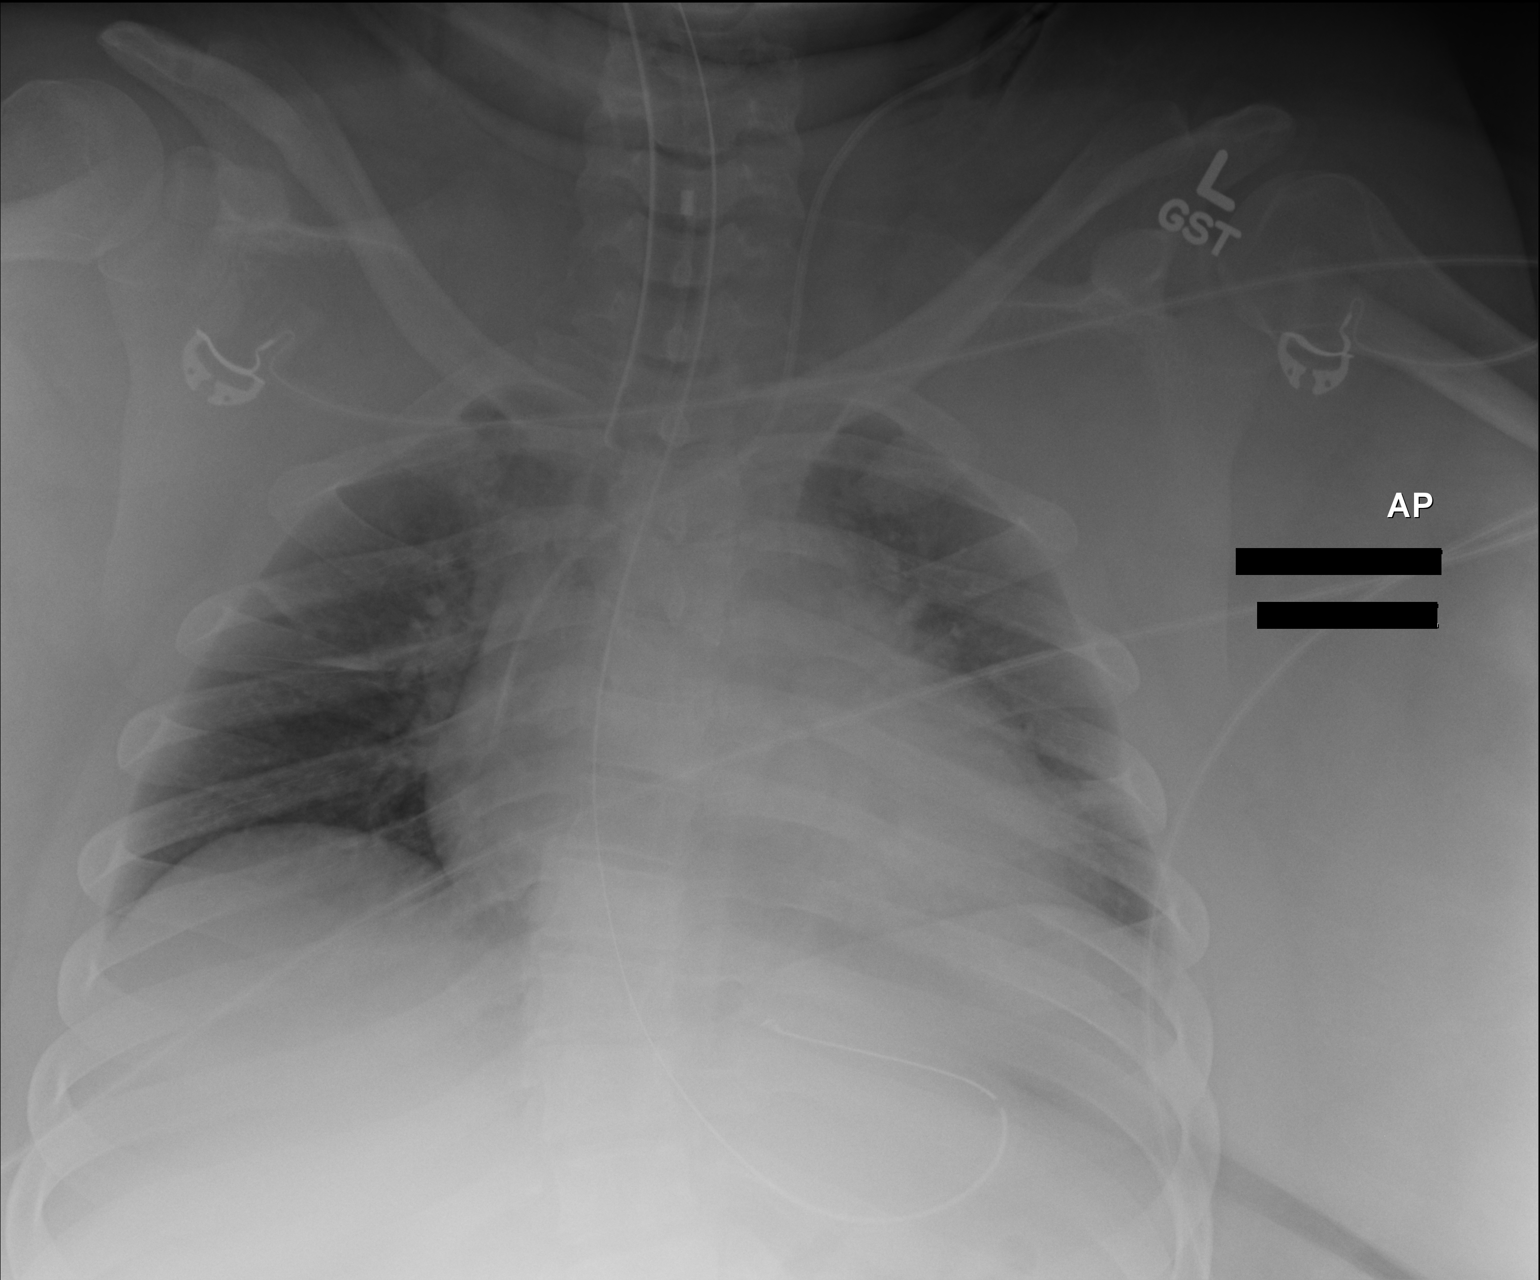

[1 of 1 positions shown; findings below may reference images not displayed]

FINDINGS: The endotracheal tube tip is 4.1 cm above the carina. There is a new
left jugular central line extending into the low SVC. Nasogastric
tube extends into the stomach. There is no pneumothorax. There is no
confluent airspace consolidation. There is no large effusion.
IMPRESSION: New left jugular central line extends into the low SVC. No
pneumothorax.

## 2017-08-18 ENCOUNTER — Ambulatory Visit (INDEPENDENT_AMBULATORY_CARE_PROVIDER_SITE_OTHER): Payer: BLUE CROSS/BLUE SHIELD | Admitting: Neurology

## 2017-08-18 ENCOUNTER — Encounter: Payer: Self-pay | Admitting: Neurology

## 2017-08-18 VITALS — BP 138/86 | HR 97 | Ht 68.0 in | Wt >= 6400 oz

## 2017-08-18 DIAGNOSIS — G40201 Localization-related (focal) (partial) symptomatic epilepsy and epileptic syndromes with complex partial seizures, not intractable, with status epilepticus: Secondary | ICD-10-CM | POA: Diagnosis not present

## 2017-08-18 MED ORDER — LAMOTRIGINE ER 200 MG PO TB24: ORAL_TABLET | ORAL | 3 refills | Status: DC

## 2017-08-18 NOTE — Progress Notes (Signed)
NEUROLOGY FOLLOW UP OFFICE NOTE  Makayla Livingston [id number]  DOB: 1996/03/07  HISTORY OF PRESENT ILLNESS: I had the pleasure of seeing Makayla Livingston in follow-up in the neurology clinic on 08/18/2017. The patient was last seen a year ago after she had new onset 7 convulsive seizures last 12/22/14. No further seizures since June 2016. She had emotional changes with Dilantin, and has switched to Lamictal, taking 100mg  BID with no further seizures or seizure-like symptoms, no side effects. She occasionally forgets the morning dose due to her work schedule. She denies any staring/unresponsive episodes, gaps in time, olfactory/gustatory hallucinations, focal numbness/tingling/weakness, myoclonic jerks. She has occasional headaches associated with stress, with sharp diffuse pain with a "beeping sound lasting 1 minute, then self-resolving. No associated nausea/vomiting/vision changes. She has occasional dizziness when standing, losing her balance sometimes where she has to stop and steady herself, no falls. She had previously good response to Trazodone, but now reports poor sleep for the past 2 weeks, around 6-7 hours of sleep. 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 She lives with her mother.   HPI: This is a pleasant 22 yo RH woman with morbid obesity, in her usual state of health until 12/22/14 when she left school early because she was feeling unwell. She fell asleep until midnight, and spoke briefly to her mother, who did not note any confusion. The next day she woke up reporting her tongue felt swollen, they went to the ER with lesions on both side of her tongue, noted to have buccal and tongue ulcerations. She was diagnosed with herpangina and discharged with Carafate. She went home and did her homework, but that evening kept asking repeatedly what her mother was saying. She told her mother she could hear but  could not understand what she was saying. This was chalked up to her tongue being swollen. She went to bed at 10pm, then at 4am her mother heard a loud sound and saw her in the bedroom with her room in disarray, she kept standing up then falling down, spinning in circles, making sound but mostly non-verbal. She did not recognize her mother and could not answer questions. When EMS arrived, she had 2 witnessed convulsions lasting 1 minute. Her mother reports she witnessed 3 more in the ER, all of the with note of head turn to the right per mother. ER notes indicate left gaze deviation. Records reviewed, she had a total of 7 convulsions that day until she was intubated for airway protection. I personally reviewed CT head without contrast (unable to fit in MRI), which showed slightly higher attenuation over the anterior frontal lobes, most compatible with beam hardening artifact, no acute changes noted. Her sedated EEG reported intermittent right-sided sharp wave activity and focal slowing, appears to have a frontal predominance with possible phase reversal at F4. She had a lumbar puncture with CSF WBC 2, RBC 3, protein 13, glucose 64, HSV PCR and CSF culture negative. UDS negative except for benzodiazepines (received Versed en route). She was started on Keppra, however after extubation, she had a "mini-breakdown" thinking she was in a mental institution and pulled her IV out, crying and being very angry. Her mother reports she kept going on about her room. She recall this and recalls texting a friend asking if she was in a mental hospital and crying. This was attributed to Keppra, which was discontinued.  Her mother reports her mood totally changed and improved that evening. She was started on Vimpat, then switched to Dilantin due to cost.   She denies any side effects on Dilantin. She feels her memory worsened after the seizures, but that it is slowly improving. It is her first day back to college today. She denies  any olfactory/gustatory hallucinations, deja vu, rising epigastric sensation, focal numbness/tingling/weakness, myoclonic jerks. No focal weakness noted after the seizures. Her mother denies any staring/unresponsiveness, no gaps in time. She reports being sleep deprived the nights prior to the seizures, no alcohol use. Her mother reports that since her hospital stay, she has had mood swings, quick to become upset and have an attitude. She was evaluated by Psychiatry during her hospitalization and was started on Cymbalta for depression and Trazodone for sleep. She will start seeing a therapist soon. She denies anyShe denies any recent infections, travels, or vaccinations.   Epilepsy Risk Factors: She had a normal birth and early development. There is no history of febrile convulsions, CNS infections such as meningitis/encephalitis, significant traumatic brain injury, neurosurgical procedures, or family history of seizures.  PAST MEDICAL HISTORY: Past Medical History:  Diagnosis Date  . Anxiety   . Depression   . Seizures [facility])     MEDICATIONS:  Outpatient Encounter Medications as of 08/18/2017  Medication Sig Note  . DULoxetine (CYMBALTA) 60 MG capsule TAKE 1 CAPSULE BY MOUTH ONCE DAILY. 08/16/2016: 30mg   . ketoconazole (NIZORAL) 2 % cream Apply 1 application topically 2 (two) times daily. Apply to affected areas of face   . ketoconazole (NIZORAL) 2 % shampoo Apply 1 application topically 2 (two) times a week. Apply to scalp (Patient not taking: Reported on 08/16/2016)   . lamoTRIgine (LAMICTAL) 100 MG tablet Take 1 tablet twice a day   . traZODone (DESYREL) 100 MG tablet Take 1 tablet (100 mg total) by mouth at bedtime.   . traZODone (DESYREL) 50 MG tablet TAKE 2 TABLETS BY MOUTH AT NIGHT (Patient not taking: Reported on 08/16/2016)   . triamcinolone ointment (KENALOG) 0.5 % Apply 1 application topically 2 (two) times daily.    No facility-administered encounter medications on file as of  08/18/2017.      ALLERGIES: Allergies  Allergen Reactions  . Penicillins Hives, Nausea And Vomiting and Rash  . Pollen Extract Other (See Comments)    Seasonal allergies    FAMILY HISTORY: Family History  Problem Relation Age of Onset  . Hypertension Mother   . Cancer Maternal Grandmother        pancreatic cancer   . Cancer Maternal Grandfather        colonc cancer    SOCIAL HISTORY: Social History   Socioeconomic History  . Marital status: Single    Spouse name: Not on file  . Number of children: Not on file  . Years of education: Not on file  . Highest education level: Not on file  Social Needs  . Financial resource strain: Not on file  . Food insecurity - worry: Not on file  . Food insecurity - inability: Not on file  . Transportation needs - medical: Not on file  . Transportation needs - non-medical: Not on file  Occupational History  . Occupation: Consulting civil engineer  Tobacco Use  . Smoking status: Never Smoker  . Smokeless tobacco: Never Used  Substance and Sexual Activity  . Alcohol use: No    Alcohol/week: 0.0 oz  . Drug use: No  . Sexual activity: Not on file  Other Topics Concern  . Not on file  Social History Narrative  . Not on file    REVIEW OF SYSTEMS: Constitutional: No fevers, chills, or sweats, no generalized fatigue, change in appetite Eyes: No visual changes, double vision, eye pain Ear, nose and throat: No hearing loss, ear pain, nasal congestion, sore throat Cardiovascular: No chest pain, palpitations Respiratory:  No shortness of breath at rest or with exertion, wheezes GastrointestinaI: No nausea, vomiting, diarrhea, abdominal pain, fecal incontinence Genitourinary:  No dysuria, urinary retention or frequency Musculoskeletal:  No neck pain, back pain Integumentary: No rash, pruritus, skin lesions Neurological: as above Psychiatric: + depression,+ insomnia, +anxiety Endocrine: No palpitations, fatigue, diaphoresis, mood swings, change in  appetite, change in weight, increased thirst Hematologic/Lymphatic:  No anemia, purpura, petechiae. Allergic/Immunologic: no itchy/runny eyes, nasal congestion, recent allergic reactions, rashes  PHYSICAL EXAM: Vitals:   08/18/17 1011 08/18/17 1021  BP: 138/86 138/86  Pulse: 97 97  SpO2:  98%   General: No acute distress, morbidly obese Head:  Normocephalic/atraumatic Neck: supple, no paraspinal tenderness, full range of motion Heart:  Regular rate and rhythm Lungs:  Clear to auscultation bilaterally Back: No paraspinal tenderness Skin/Extremities: No rash, no edema Neurological Exam: alert and oriented to person, place, and time. No aphasia or dysarthria. Fund of knowledge is appropriate.  Recent and remote memory are intact.  Attention and concentration are normal.    Able to name objects and repeat phrases. Cranial nerves: Pupils equal, round, reactive to light. Extraocular movements intact with no nystagmus. Visual fields full. Facial sensation intact. No facial asymmetry. Tongue, uvula, palate midline.  Motor: Bulk and tone normal, muscle strength 5/5 throughout with no pronator drift.  Sensation to light touch intact.  No extinction to double simultaneous stimulation.  Deep tendon reflexes 2+ throughout, toes downgoing.  Finger to nose testing intact.  Gait narrow-based and steady, able to tandem walk adequately.  Romberg negative.  IMPRESSION: This is a pleasant 22 yo RH woman with morbid obesity, no other clear epilepsy risk factors, with new onset seizures last June 2016. She was admitted 12/24/14 with a total of 7 convulsions that day, and prior to that she had woken up with tongue ulcerations and was noted to have some confusion. Her sedated EEG showed possible sharp waves over the right frontal region. MRI brain with and without contrast normal, repeat 1-hour wake and sleep EEG normal. She denies any further seizures since June 2016. She is tolerating Lamotrigine 100mg  BID with no side  effects, but misses the morning dose due to work schedule. She will be switched to Lamotrigine ER 200mg  qhs to help with compliance. She will discuss mood and sleep issues with her psychiatrist. She is not driving and is aware of [place] driving laws to stop driving after a seizure, until 6 months seizure-free. She will follow-up in 1 year and knows to call our office for any changes.   Thank you for allowing me to participate in her care.  Please do not hesitate to call for any questions or concerns.  The duration of this appointment visit was 25 minutes of face-to-face time with the patient.  Greater than 50% of this time was spent in counseling, explanation of diagnosis, planning of further management, and coordination of care.   Patrcia DollyKaren Benton Tooker, M.D.   CC: Dr. Armen PickupFunches

## 2017-08-18 NOTE — Patient Instructions (Signed)
1. We will switch to the extended-release Lamictal that you will only take once a day: switch to Lamictal XR 200mg , take 1 tablet at night 2. Discuss sleep and mood with your psychiatrist 3. Follow-up in 1 year, call for any changes  Seizure Precautions: 1. If medication has been prescribed for you to prevent seizures, take it exactly as directed.  Do not stop taking the medicine without talking to your doctor first, even if you have not had a seizure in a long time.   2. Avoid activities in which a seizure would cause danger to yourself or to others.  Don't operate dangerous machinery, swim alone, or climb in high or dangerous places, such as on ladders, roofs, or girders.  Do not drive unless your doctor says you may.  3. If you have any warning that you may have a seizure, lay down in a safe place where you can't hurt yourself.    4.  No driving for 6 months from last seizure, [place].   Please refer to the following link on the [employer]'s website for more information: http://www.epilepsyfoundation.org/answerplace/Social/driving/drivingu.cfm   5.  Maintain good sleep hygiene. Avoid alcohol.  6.  Notify your neurology if you are planning pregnancy or if you become pregnant.  7.  Contact your doctor if you have any problems that may be related to the medicine you are taking.  8.  Call 911 and bring the patient back to the ED if:        A.  The seizure lasts longer than 5 minutes.       B.  The patient doesn't awaken shortly after the seizure  C.  The patient has new problems such as difficulty seeing, speaking or moving  D.  The patient was injured during the seizure  E.  The patient has a temperature over 102 F (39C)  F.  The patient vomited and now is having trouble breathing

## 2017-08-27 ENCOUNTER — Encounter: Payer: Self-pay | Admitting: Neurology

## 2018-08-20 ENCOUNTER — Ambulatory Visit: Payer: Self-pay | Admitting: Neurology

## 2019-03-06 ENCOUNTER — Other Ambulatory Visit: Payer: Self-pay

## 2019-03-06 DIAGNOSIS — Z20822 Contact with and (suspected) exposure to covid-19: Secondary | ICD-10-CM

## 2019-03-07 LAB — NOVEL CORONAVIRUS, NAA: SARS-CoV-2, NAA: NOT DETECTED

## 2020-05-18 ENCOUNTER — Emergency Department (HOSPITAL_BASED_OUTPATIENT_CLINIC_OR_DEPARTMENT_OTHER)
Admission: EM | Admit: 2020-05-18 | Discharge: 2020-05-18 | Disposition: A | Discharge status: Home / Self Care | Payer: BC Managed Care – PPO | Attending: Emergency Medicine | Admitting: Emergency Medicine

## 2020-05-18 ENCOUNTER — Encounter (HOSPITAL_BASED_OUTPATIENT_CLINIC_OR_DEPARTMENT_OTHER): Payer: Self-pay

## 2020-05-18 ENCOUNTER — Emergency Department (HOSPITAL_BASED_OUTPATIENT_CLINIC_OR_DEPARTMENT_OTHER): Payer: BC Managed Care – PPO

## 2020-05-18 ENCOUNTER — Other Ambulatory Visit: Payer: Self-pay

## 2020-05-18 DIAGNOSIS — M25561 Pain in right knee: Secondary | ICD-10-CM | POA: Insufficient documentation

## 2020-05-18 DIAGNOSIS — M1711 Unilateral primary osteoarthritis, right knee: Secondary | ICD-10-CM | POA: Diagnosis not present

## 2020-05-18 DIAGNOSIS — M25461 Effusion, right knee: Secondary | ICD-10-CM | POA: Insufficient documentation

## 2020-05-18 DIAGNOSIS — S8991XA Unspecified injury of right lower leg, initial encounter: Secondary | ICD-10-CM | POA: Diagnosis not present

## 2020-05-18 DIAGNOSIS — R600 Localized edema: Secondary | ICD-10-CM | POA: Diagnosis not present

## 2020-05-18 MED ORDER — HYDROCODONE-ACETAMINOPHEN 5-325 MG PO TABS
1.0000 | ORAL_TABLET | Freq: Once | ORAL | Status: AC
  Administered 2020-05-18: 1 via ORAL
  Filled 2020-05-18: qty 1

## 2020-05-18 MED ORDER — IBUPROFEN 800 MG PO TABS: 800.0000 mg | ORAL_TABLET | Freq: Three times a day (TID) | ORAL | 0 refills | Status: DC | PRN

## 2020-05-18 MED ORDER — IBUPROFEN 800 MG PO TABS
800.0000 mg | ORAL_TABLET | Freq: Once | ORAL | Status: AC
  Administered 2020-05-18: 800 mg via ORAL
  Filled 2020-05-18: qty 1

## 2020-05-18 NOTE — ED Notes (Addendum)
Unable to use knee immobilizer or provide crutches to patient. Provider notified, alternative ace wrap and prescription for bariatric crutches ordered instead.

## 2020-05-18 NOTE — ED Triage Notes (Signed)
Pt arrives with pain to right knee starting last night, denies any injury. Pt did use Voltaren cream with no relief.

## 2020-05-18 NOTE — ED Provider Notes (Signed)
[facility] EMERGENCY DEPARTMENT Provider Note   CSN: [id number] Arrival date & time: 05/18/20  1606     History Chief Complaint  Patient presents with  . Knee Pain    Makayla Livingston is a 24 y.o. female.  The history is provided by the patient.  Knee Pain  Makayla Livingston is a 24 y.o. female who presents to the Emergency Department complaining of knee pain. She presents the emergency department complaining of atraumatic right knee pain. She states that she was in bed last night and did have her leg slightly bent. When she went to stand she felt pain in her right anterior and posterior knee. Pain is constant but worse with flexion extension as well as with standing. The pain is too severe to stand. She denies any associated hip pain, foot pain, fevers, shortness of breath, nausea, vomiting, abdominal pain. She has not tried any oral medications for her pain. No prior similar symptoms.    Past Medical History:  Diagnosis Date  . Anxiety   . Depression   . Seizures [facility])     Patient Active Problem List   Diagnosis Date Noted  . Insomnia 02/05/2016  . Difficulty concentrating 12/15/2015  . Snoring 12/15/2015  . Seborrhea 09/29/2015  . Generalized anxiety disorder 04/10/2015  . Localization-related symptomatic epilepsy and epileptic syndromes with complex partial seizures, not intractable, with status epilepticus (HCC) 03/02/2015  . Vitamin D deficiency 01/12/2015  . Morbid obesity (HCC) 12/30/2014  . MDD (major depressive disorder), single episode 12/29/2014    History reviewed. No pertinent surgical history.   OB History   No obstetric history on file.     Family History  Problem Relation Age of Onset  . Hypertension Mother   . Cancer Maternal Grandmother        pancreatic cancer   . Cancer Maternal Grandfather        colonc cancer    Social History   Tobacco Use  . Smoking status: Never Smoker  . Smokeless tobacco: Never Used  Substance Use Topics   . Alcohol use: No    Alcohol/week: 0.0 standard drinks  . Drug use: No    Home Medications Prior to Admission medications   Medication Sig Start Date End Date Taking? Authorizing Provider  DULoxetine (CYMBALTA) 60 MG capsule TAKE 1 CAPSULE BY MOUTH ONCE DAILY. Patient not taking: Reported on 08/18/2017 07/21/16   [name], MD  ibuprofen (ADVIL) 800 MG tablet Take 1 tablet (800 mg total) by mouth every 8 (eight) hours as needed. 05/18/20   Tilden Fossa, MD  ketoconazole (NIZORAL) 2 % cream Apply 1 application topically 2 (two) times daily. Apply to affected areas of face Patient not taking: Reported on 08/18/2017 12/15/15   [name], MD  ketoconazole (NIZORAL) 2 % shampoo Apply 1 application topically 2 (two) times a week. Apply to scalp Patient not taking: Reported on 08/16/2016 12/15/15   [name], MD  LamoTRIgine 200 MG TB24 24 hour tablet Take 1 tablet at night 08/18/17   [name], MD  traZODone (DESYREL) 100 MG tablet Take 1 tablet (100 mg total) by mouth at bedtime. 02/05/16   [name], MD  traZODone (DESYREL) 50 MG tablet TAKE 2 TABLETS BY MOUTH AT NIGHT 02/22/16   [name], MD  triamcinolone ointment (KENALOG) 0.5 % Apply 1 application topically 2 (two) times daily. Patient not taking: Reported on 08/18/2017 12/15/15   [name], MD    Allergies  Penicillins and Pollen extract  Review of Systems   Review of Systems  All other systems reviewed and are negative.   Physical Exam Updated Vital Signs BP 128/88 (BP Location: Right Wrist)   Pulse (!) 108   Temp 98.8 F (37.1 C) (Oral)   Resp 17   Ht 5\' 7"  (1.702 m)   Wt (!) 181.4 kg   LMP 05/18/2020   SpO2 100%   BMI 62.65 kg/m   Physical Exam Vitals and nursing note reviewed.  Constitutional:      Appearance: Normal appearance.  HENT:     Head: Normocephalic and atraumatic.  Cardiovascular:     Rate and Rhythm: Regular rhythm.  Pulmonary:     Effort: Pulmonary effort is  normal. No respiratory distress.  Musculoskeletal:     Cervical back: Neck supple.     Comments: 2+ DP pulses bilaterally. There is mild tenderness to palpation over the right anterior knee. There is no appreciable edema or erythema over the knee. Flexion/ extension is intact in the knee but there is pain with range of motion.  Skin:    General: Skin is warm and dry.     Capillary Refill: Capillary refill takes less than 2 seconds.  Neurological:     Mental Status: She is alert and oriented to person, place, and time.  Psychiatric:        Mood and Affect: Mood normal.        Behavior: Behavior normal.     ED Results / Procedures / Treatments   Labs (all labs ordered are listed, but only abnormal results are displayed) Labs Reviewed - No data to display  EKG None  Radiology CT Knee Right Wo Contrast  Result Date: 05/18/2020 CLINICAL DATA:  Acute right knee pain since last night.  No injury. EXAM: CT OF THE RIGHT KNEE WITHOUT CONTRAST TECHNIQUE: Multidetector CT imaging of the right knee was performed according to the standard protocol. Multiplanar CT image reconstructions were also generated. COMPARISON:  Right knee x-rays from same day. FINDINGS: Bones/Joint/Cartilage No fracture or dislocation. Fragmentation of the tibial tuberosity. Tiny marginal medial and lateral compartment osteophytes. Joint spaces are preserved. Moderate joint effusion. Ligaments Ligaments are suboptimally evaluated by CT. Muscles and Tendons Grossly intact. Thickened distal patellar tendon. No muscle atrophy. Soft tissue Soft tissue swelling overlying the tibial tuberosity. No fluid collection or hematoma. No soft tissue mass. IMPRESSION: 1. No acute osseous abnormality.  Moderate joint effusion. 2. Chronic, unresolved Osgood-Schlatter disease. 3. Early degenerative changes of the medial and lateral compartments. Electronically Signed   By: 13/07/2019 M.D.   On: 05/18/2020 20:13   DG Knee Complete 4 Views  Right  Result Date: 05/18/2020 CLINICAL DATA:  Right knee sudden onset pain today. No older recent injury. EXAM: RIGHT KNEE - COMPLETE 4+ VIEW COMPARISON:  None. FINDINGS: Cortical irregularity along the lateral tibial plateau of unclear etiology. No definite acute displaced fracture or dislocation of the right knee. Suggestion of a joint effusion. No evidence of severe arthropathy or aggressive appearing focal bone abnormality. Severe soft tissue edema of the lateral knee. IMPRESSION: Cortical irregularity along the lateral tibial plateau of unclear etiology the could represent an age-indeterminate fracture. Associated right knee joint effusion. Correlate with physical exam. If clinically indicated, consider cross-sectional imaging for further evaluation. Electronically Signed   By: 13/07/2019 M.D.   On: 05/18/2020 18:54    Procedures Procedures (including critical care time)  Medications Ordered in ED Medications  HYDROcodone-acetaminophen (NORCO/VICODIN) 5-325  MG per tablet 1 tablet (has no administration in time range)  ibuprofen (ADVIL) tablet 800 mg (800 mg Oral Given 05/18/20 1816)    ED Course  I have reviewed the triage vital signs and the nursing notes.  Pertinent labs & imaging results that were available during my care of the patient were reviewed by me and considered in my medical decision making (see chart for details).    MDM Rules/Calculators/A&P                         patient here for evaluation of atraumatic right knee pain. She is neurovascular early intact on examination. Exam is not concerning for gouty arthritis, septic arthritis. History is not concerning for spontaneous dislocation. X-ray with possible tibial plateau fracture. CT scan obtained, which is negative for fracture but does demonstrate joint effusion. Discussed with patient crutches for weight-bearing as tolerated with orthopedics follow-up. Return precautions discussed.  Final Clinical Impression(s) /  ED Diagnoses Final diagnoses:  Acute pain of right knee  Effusion of right knee    Rx / DC Orders ED Discharge Orders         Ordered    ibuprofen (ADVIL) 800 MG tablet  Every 8 hours PRN        05/18/20 2042           Tilden Fossa, MD 05/18/20 2047

## 2020-05-19 DIAGNOSIS — M25561 Pain in right knee: Secondary | ICD-10-CM | POA: Diagnosis not present

## 2020-07-21 ENCOUNTER — Emergency Department (HOSPITAL_BASED_OUTPATIENT_CLINIC_OR_DEPARTMENT_OTHER)
Admission: EM | Admit: 2020-07-21 | Discharge: 2020-07-21 | Disposition: A | Discharge status: Home / Self Care | Payer: BC Managed Care – PPO | Attending: Emergency Medicine | Admitting: Emergency Medicine

## 2020-07-21 ENCOUNTER — Encounter (HOSPITAL_BASED_OUTPATIENT_CLINIC_OR_DEPARTMENT_OTHER): Payer: Self-pay

## 2020-07-21 ENCOUNTER — Other Ambulatory Visit: Payer: Self-pay

## 2020-07-21 DIAGNOSIS — J029 Acute pharyngitis, unspecified: Secondary | ICD-10-CM | POA: Insufficient documentation

## 2020-07-21 DIAGNOSIS — Z20822 Contact with and (suspected) exposure to covid-19: Secondary | ICD-10-CM | POA: Insufficient documentation

## 2020-07-21 DIAGNOSIS — R509 Fever, unspecified: Secondary | ICD-10-CM | POA: Diagnosis not present

## 2020-07-21 DIAGNOSIS — R110 Nausea: Secondary | ICD-10-CM | POA: Diagnosis not present

## 2020-07-21 DIAGNOSIS — Z5321 Procedure and treatment not carried out due to patient leaving prior to being seen by health care provider: Secondary | ICD-10-CM | POA: Diagnosis not present

## 2020-07-21 DIAGNOSIS — M791 Myalgia, unspecified site: Secondary | ICD-10-CM | POA: Diagnosis not present

## 2020-07-21 DIAGNOSIS — R0789 Other chest pain: Secondary | ICD-10-CM | POA: Diagnosis not present

## 2020-07-21 DIAGNOSIS — R112 Nausea with vomiting, unspecified: Secondary | ICD-10-CM | POA: Diagnosis not present

## 2020-07-21 DIAGNOSIS — R5383 Other fatigue: Secondary | ICD-10-CM | POA: Diagnosis not present

## 2020-07-21 DIAGNOSIS — U071 COVID-19: Secondary | ICD-10-CM | POA: Diagnosis not present

## 2020-07-21 DIAGNOSIS — R059 Cough, unspecified: Secondary | ICD-10-CM | POA: Diagnosis not present

## 2020-07-21 NOTE — ED Triage Notes (Signed)
Presents with covid concern- nausea, cough, chest congestion, sore throat, and body aches

## 2020-11-17 ENCOUNTER — Other Ambulatory Visit: Payer: Self-pay

## 2020-11-17 ENCOUNTER — Ambulatory Visit (INDEPENDENT_AMBULATORY_CARE_PROVIDER_SITE_OTHER): Payer: BC Managed Care – PPO | Admitting: Advanced Practice Midwife

## 2020-11-17 ENCOUNTER — Encounter: Payer: Self-pay | Admitting: Advanced Practice Midwife

## 2020-11-17 ENCOUNTER — Encounter: Payer: Self-pay | Admitting: General Practice

## 2020-11-17 VITALS — BP 103/68 | HR 86 | Ht 67.0 in | Wt 358.0 lb

## 2020-11-17 DIAGNOSIS — F1290 Cannabis use, unspecified, uncomplicated: Secondary | ICD-10-CM

## 2020-11-17 DIAGNOSIS — R112 Nausea with vomiting, unspecified: Secondary | ICD-10-CM | POA: Diagnosis not present

## 2020-11-17 MED ORDER — ONDANSETRON 4 MG PO TBDP: 4.0000 mg | ORAL_TABLET | Freq: Three times a day (TID) | ORAL | 0 refills | Status: DC | PRN

## 2020-11-17 NOTE — Progress Notes (Signed)
GYNECOLOGY ANNUAL PREVENTATIVE CARE ENCOUNTER NOTE  Subjective:   Makayla Livingston is a 25 y.o. G0P0000 female here for a routine annual gynecologic exam. She states she does NOT want a GYN exam.  She thought we were a primary care office.  Adamantly refuses any kind of GYN exam.   Current complaints: daily vomiting which she thinks is due to Cannaboid Hyperemesis.  .   Denies abnormal vaginal bleeding, discharge, pelvic pain, problems with intercourse or other gynecologic concerns. States she is not sexually active and does not want any kind of GYN testing or exam  Refuses PAP   Gynecologic History Patient's last menstrual period was 11/08/2020. Contraception: none Last Pap: Never  Obstetric History OB History  Gravida Para Term Preterm AB Living  0 0 0 0 0 0  SAB IAB Ectopic Multiple Live Births  0 0 0 0 0    Past Medical History:  Diagnosis Date  . Anxiety   . Depression   . Seizures (HCC)     History reviewed. No pertinent surgical history.  Current Outpatient Medications on File Prior to Visit  Medication Sig Dispense Refill  . DULoxetine (CYMBALTA) 60 MG capsule TAKE 1 CAPSULE BY MOUTH ONCE DAILY. (Patient not taking: No sig reported) 30 capsule 2  . ibuprofen (ADVIL) 800 MG tablet Take 1 tablet (800 mg total) by mouth every 8 (eight) hours as needed. (Patient not taking: Reported on 11/17/2020) 15 tablet 0  . ketoconazole (NIZORAL) 2 % cream Apply 1 application topically 2 (two) times daily. Apply to affected areas of face (Patient not taking: No sig reported) 60 g 11  . ketoconazole (NIZORAL) 2 % shampoo Apply 1 application topically 2 (two) times a week. Apply to scalp (Patient not taking: No sig reported) 120 mL 11  . LamoTRIgine 200 MG TB24 24 hour tablet Take 1 tablet at night (Patient not taking: Reported on 11/17/2020) 90 tablet 3  . traZODone (DESYREL) 100 MG tablet Take 1 tablet (100 mg total) by mouth at bedtime. (Patient not taking: Reported on 11/17/2020) 30 tablet 5   . traZODone (DESYREL) 50 MG tablet TAKE 2 TABLETS BY MOUTH AT NIGHT (Patient not taking: Reported on 11/17/2020) 180 tablet 5  . triamcinolone ointment (KENALOG) 0.5 % Apply 1 application topically 2 (two) times daily. (Patient not taking: No sig reported) 30 g 0   No current facility-administered medications on file prior to visit.    Allergies  Allergen Reactions  . Penicillins Hives, Nausea And Vomiting and Rash  . Pollen Extract Other (See Comments)    Seasonal allergies    Social History   Socioeconomic History  . Marital status: Single    Spouse name: Not on file  . Number of children: Not on file  . Years of education: Not on file  . Highest education level: Not on file  Occupational History  . Occupation: Consulting civil engineer  Tobacco Use  . Smoking status: Never Smoker  . Smokeless tobacco: Never Used  Substance and Sexual Activity  . Alcohol use: No    Alcohol/week: 0.0 standard drinks  . Drug use: No  . Sexual activity: Not Currently    Birth control/protection: None  Other Topics Concern  . Not on file  Social History Narrative  . Not on file   Social Determinants of Health   Financial Resource Strain: Not on file  Food Insecurity: Not on file  Transportation Needs: Not on file  Physical Activity: Not on file  Stress: Not on file  Social Connections: Not on file  Intimate Partner Violence: Not on file    Family History  Problem Relation Age of Onset  . Hypertension Mother   . Cancer Maternal Grandmother        pancreatic cancer   . Cancer Maternal Grandfather        colonc cancer    The following portions of the patient's history were reviewed and updated as appropriate: allergies, current medications, past family history, past medical history, past social history, past surgical history and problem list.  Review of Systems Pertinent items are noted in HPI.   Objective:  BP 103/68   Pulse 86   Ht 5\' 7"  (1.702 m)   Wt (!) 358 lb (162.4 kg)   LMP  11/08/2020   BMI 56.07 kg/m  CONSTITUTIONAL: Well-developed female in no acute distress.  SKIN: Skin is warm and dry. No rash noted. Not diaphoretic. No erythema. No pallor. NEUROLOGIC: Alert and oriented to person, place, and time.  PSYCHIATRIC: Normal mood and affect. Normal behavior. Normal judgment and thought content. CARDIOVASCULAR: Normal heart rate noted RESPIRATORY:  Effort normal, no problems with respiration noted. 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 Discussed constipation tx with OTC Miralax and daily fiber with water If not improved then would need GI consult   20 minutes spent on counseling  Discussed how to find a Primary Care provider  Please refer to After Visit Summary for other counseling recommendations.

## 2020-11-17 NOTE — Patient Instructions (Addendum)
Cannabinoid Hyperemesis Syndrome Cannabinoid hyperemesis syndrome (CHS) is a condition that causes repeated nausea, vomiting, and abdominal pain after long-term (chronic) use of marijuana (cannabis). People with CHS typically use marijuana 3-5 times a day for many years before they have symptoms, although it is possible to develop CHS with far less daily use. Symptoms of CHS may be mild at first but can get worse and more frequent. In some cases, CHS may cause severe daily vomiting, which can lead to weight loss and dehydration. What are the causes? The exact cause of this condition is not known. Long-term use of marijuana may over-stimulate certain proteins in the brain and gastrointestinal tract that react with chemicals in marijuana (cannabinoid receptors). This over-stimulation may cause CHS. What are the signs or symptoms? Symptoms of this condition are often mild during the first few episodes, but they can get worse over time. Symptoms may include:  Frequent nausea, especially early in the morning.  Vomiting. This can become severe.  Abdominal pain.  Feeling very tired (lethargic).  Headaches. CHS may go away and come back many times (recur). People may not have symptoms or may otherwise be healthy in between CHS episodes. Taking hot showers can relieve the symptoms of CHS, so feeling the need to take several hot showers throughout the day can be a sign of this condition. How is this diagnosed? This condition may be diagnosed based on:  Your symptoms and medical history, including any drug use.  A physical exam. You may have tests done to rule out other problems that could cause your symptoms. These tests may include:  Blood tests.  Urine tests.  Imaging tests, such as an X-ray or a CT scan. How is this treated? Treatment for this condition involves stopping marijuana use. Treatment may include:  A drug rehabilitation program, if you have trouble stopping marijuana  use.  Medicines for nausea. These may be given at the hospital through an IV inserted into one of your veins, or they may be medicines that you take by mouth (orally).  Certain creams that contain a substance called capsaicin. These may improve symptoms when applied to the abdomen.  Hot showers to help relieve symptoms. In severe cases, you may need treatment at a hospital. You may be given IV fluids to prevent or treat dehydration as well as medicines to treat nausea, vomiting, and pain. Follow these instructions at home: During an episode of CHS  Stay in bed and rest in a dark, quiet room.  Take anti-nausea medicine as told by your health care provider.  Try taking hot showers to relieve your symptoms.   After an episode of CHS  Drink small amounts of clear fluids slowly. Gradually add more if you can keep the fluids down without vomiting.  Once you are able to eat without vomiting, eat soft foods in small amounts every 3-4 hours. General instructions  Do not use any products that contain marijuana.If you need help quitting, ask your health care provider for resources and treatment options.  Drink enough fluid to keep your urine pale yellow. Avoid drinking fluids that have a lot of sugar or caffeine, such as coffee and soda.  Take and apply over-the-counter and prescription medicines only as told by your health care provider. Ask your health care provider before starting any new medicines or treatments.  Keep all follow-up visits as told by your health care provider. This is important. This includes any recommended substance abuse programs.   Contact a health care provider   if:  Your symptoms get worse.  You cannot drink fluids without vomiting or severe pain.  You have pain and trouble swallowing after an episode. Get help right away if you:  Cannot stop vomiting.  Have blood in your vomit or your vomit looks like coffee grounds.  Have severe abdominal pain.  Have  stools that are bloody or black, or stools that look like tar.  Have symptoms of dehydration, such as: ? Sunken eyes. ? Inability to make tears. ? Cracked lips. ? Dry mouth. ? Decreased urine production. ? Weakness. ? Sleepiness. ? Dizziness, light-headedness, or fainting. Summary  Cannabinoid hyperemesis syndrome (CHS) is a condition that causes repeated nausea, vomiting, and abdominal pain after long-term use of marijuana.  People with CHS typically use marijuana 3-5 times a day for many years before they have symptoms, although it is possible to develop CHS with much less daily use.  Treatment for this condition involves stopping marijuana use. Hot showers and capsaicin creams may also help relieve symptoms. Ask your health care provider before starting any medicines or other treatments.  Your health care provider may prescribe medicines to help with nausea.  Get help right away if you have signs of dehydration, such as dry mouth, decreased urine production, weakness, dizziness, and light-headedness. This information is not intended to replace advice given to you by your health care provider. Make sure you discuss any questions you have with your health care provider. Document Revised: 05/01/2019 Document Reviewed: 05/01/2019 Elsevier Patient Education  2021 Else[employer] Inc.  Pap Test Why am I having this test? A Pap test, also called a Pap smear, is a screening test to check for signs of:  Cancer of the vagina, cervix, and uterus. The cervix is the lower part of the uterus that opens into the vagina.  Infection.  Changes that may be a sign that cancer is developing (precancerous changes). Women need this test on a regular basis. In general, you should have a Pap test every 3 years until you reach menopause or age 54. Women aged 30-60 may choose to have their Pap test done at the same time as an HPV (human papillomavirus) test every 5 years (instead of every 3 years). Your health  care provider may recommend having Pap tests more or less often depending on your medical conditions and past Pap test results. What kind of sample is taken? Your health care provider will collect a sample of cells from the surface of your cervix. This will be done using a small cotton swab, plastic spatula, or brush. This sample is often collected during a pelvic exam, when you are lying on your back on an exam table with feet in footrests (stirrups). In some cases, fluids (secretions) from the cervix or vagina may also be collected.   How do I prepare for this test?  Be aware of where you are in your menstrual cycle. If you are menstruating on the day of the test, you may be asked to reschedule.  You may need to reschedule if you have a known vaginal infection on the day of the test.  Follow instructions from your health care provider about: ? Changing or stopping your regular medicines. Some medicines can cause abnormal test results, such as digitalis and tetracycline. ? Avoiding douching or taking a bath the day before or the day of the test. Tell a health care provider about:  Any allergies you have.  All medicines you are taking, including vitamins, herbs, eye drops, creams,  and over-the-counter medicines.  Any blood disorders you have.  Any surgeries you have had.  Any medical conditions you have.  Whether you are pregnant or may be pregnant. How are the results reported? Your test results will be reported as either abnormal or normal. A false-positive result can occur. A false positive is incorrect because it means that a condition is present when it is not. A false-negative result can occur. A false negative is incorrect because it means that a condition is not present when it is. What do the results mean? A normal test result means that you do not have signs of cancer of the vagina, cervix, or uterus. An abnormal result may mean that you have:  Cancer. A Pap test by itself  is not enough to diagnose cancer. You will have more tests done in this case.  Precancerous changes in your vagina, cervix, or uterus.  Inflammation of the cervix.  An STD (sexually transmitted disease).  A fungal infection.  A parasite infection. Talk with your health care provider about what your results mean. Questions to ask your health care provider Ask your health care provider, or the department that is doing the test:  When will my results be ready?  How will I get my results?  What are my treatment options?  What other tests do I need?  What are my next steps? Summary  In general, women should have a Pap test every 3 years until they reach menopause or age 89.  Your health care provider will collect a sample of cells from the surface of your cervix. This will be done using a small cotton swab, plastic spatula, or brush.  In some cases, fluids (secretions) from the cervix or vagina may also be collected. This information is not intended to replace advice given to you by your health care provider. Make sure you discuss any questions you have with your health care provider. Document Revised: 03/11/2020 Document Reviewed: 03/06/2020 Elsevier Patient Education  2021 [employer].

## 2020-11-19 DIAGNOSIS — R112 Nausea with vomiting, unspecified: Secondary | ICD-10-CM | POA: Diagnosis not present

## 2020-11-19 DIAGNOSIS — Z3202 Encounter for pregnancy test, result negative: Secondary | ICD-10-CM | POA: Diagnosis not present

## 2020-11-19 DIAGNOSIS — E860 Dehydration: Secondary | ICD-10-CM | POA: Diagnosis not present

## 2020-12-01 DIAGNOSIS — Z6841 Body Mass Index (BMI) 40.0 and over, adult: Secondary | ICD-10-CM | POA: Diagnosis not present

## 2020-12-01 DIAGNOSIS — Z20822 Contact with and (suspected) exposure to covid-19: Secondary | ICD-10-CM | POA: Diagnosis not present

## 2020-12-01 DIAGNOSIS — J029 Acute pharyngitis, unspecified: Secondary | ICD-10-CM | POA: Diagnosis not present

## 2020-12-07 ENCOUNTER — Other Ambulatory Visit: Payer: Self-pay | Admitting: Family Medicine

## 2020-12-07 ENCOUNTER — Other Ambulatory Visit: Payer: Self-pay

## 2020-12-07 ENCOUNTER — Encounter: Payer: Self-pay | Admitting: Family Medicine

## 2020-12-07 ENCOUNTER — Other Ambulatory Visit (INDEPENDENT_AMBULATORY_CARE_PROVIDER_SITE_OTHER): Payer: BC Managed Care – PPO

## 2020-12-07 ENCOUNTER — Ambulatory Visit (INDEPENDENT_AMBULATORY_CARE_PROVIDER_SITE_OTHER): Payer: BC Managed Care – PPO | Admitting: Family Medicine

## 2020-12-07 VITALS — BP 128/72 | HR 55 | Temp 98.9°F | Ht 67.5 in | Wt 360.0 lb

## 2020-12-07 DIAGNOSIS — E785 Hyperlipidemia, unspecified: Secondary | ICD-10-CM

## 2020-12-07 DIAGNOSIS — Z09 Encounter for follow-up examination after completed treatment for conditions other than malignant neoplasm: Secondary | ICD-10-CM | POA: Diagnosis not present

## 2020-12-07 DIAGNOSIS — Z0289 Encounter for other administrative examinations: Secondary | ICD-10-CM

## 2020-12-07 DIAGNOSIS — E611 Iron deficiency: Secondary | ICD-10-CM

## 2020-12-07 DIAGNOSIS — Z1159 Encounter for screening for other viral diseases: Secondary | ICD-10-CM | POA: Diagnosis not present

## 2020-12-07 DIAGNOSIS — Z0000 Encounter for general adult medical examination without abnormal findings: Secondary | ICD-10-CM

## 2020-12-07 LAB — CBC
HCT: 38.2 % (ref 36.0–46.0)
Hemoglobin: 12.2 g/dL (ref 12.0–15.0)
MCHC: 31.9 g/dL (ref 30.0–36.0)
MCV: 74.8 fl — ABNORMAL LOW (ref 78.0–100.0)
Platelets: 304 10*3/uL (ref 150.0–400.0)
RBC: 5.11 Mil/uL (ref 3.87–5.11)
RDW: 16.4 % — ABNORMAL HIGH (ref 11.5–15.5)
WBC: 7.1 10*3/uL (ref 4.0–10.5)

## 2020-12-07 LAB — COMPREHENSIVE METABOLIC PANEL
ALT: 17 U/L (ref 0–35)
AST: 16 U/L (ref 0–37)
Albumin: 3.9 g/dL (ref 3.5–5.2)
Alkaline Phosphatase: 67 U/L (ref 39–117)
BUN: 7 mg/dL (ref 6–23)
CO2: 25 mEq/L (ref 19–32)
Calcium: 9.2 mg/dL (ref 8.4–10.5)
Chloride: 108 mEq/L (ref 96–112)
Creatinine, Ser: 0.96 mg/dL (ref 0.40–1.20)
GFR: 82.1 mL/min (ref >60.00)
Glucose, Bld: 99 mg/dL (ref 70–99)
Potassium: 3.9 mEq/L (ref 3.5–5.1)
Sodium: 141 mEq/L (ref 135–145)
Total Bilirubin: 0.4 mg/dL (ref 0.2–1.2)
Total Protein: 7.1 g/dL (ref 6.0–8.3)

## 2020-12-07 LAB — LIPID PANEL
Cholesterol: 212 mg/dL — ABNORMAL HIGH (ref 0–200)
HDL: 45.3 mg/dL (ref >39.00)
LDL Cholesterol: 150 mg/dL — ABNORMAL HIGH (ref 0–99)
NonHDL: 166.89
Total CHOL/HDL Ratio: 5
Triglycerides: 82 mg/dL (ref 0.0–149.0)
VLDL: 16.4 mg/dL (ref 0.0–40.0)

## 2020-12-07 LAB — IBC + FERRITIN
Ferritin: 23.2 ng/mL (ref 10.0–291.0)
Iron: 43 ug/dL (ref 42–145)
Saturation Ratios: 11.1 % — ABNORMAL LOW (ref 20.0–50.0)
Transferrin: 277 mg/dL (ref 212.0–360.0)

## 2020-12-07 NOTE — Patient Instructions (Addendum)
Consider a multivitamin.  Consider meal prepping.  Give [place] 2-3 business days to get the results of your labs back.   Keep the diet clean and stay active.  Aim to do some physical exertion for 150 minutes per week. This is typically divided into 5 days per week, 30 minutes per day. The activity should be enough to get your heart rate up. Anything is better than nothing if you have time constraints.  Call Center for [facility] at [phone] for an appointment.  They are located at [address], [place], [place], [address] (right across the hall from our office).  If you do not hear anything about your referral in the next 1-2 weeks, call our office and ask for an update.  Let us know if you need anything.

## 2020-12-07 NOTE — Progress Notes (Signed)
Chief Complaint  Patient presents with  . New Patient (Initial Visit)     Well Woman Makayla Livingston is here for a complete physical.   Her last physical was >1 year ago.  Current diet: in general, a "poor" diet. Current exercise: none. Patient's last menstrual period was 11/08/2020. Fatigue out of ordinary? No Seatbelt? Yes  Health Maintenance Pap/HPV- No Tetanus- No HIV screening- Yes Hep C screening- No   Patient was seen in the ED on 5/5.  She was seen for nausea and vomiting.  She missed work on 5/5 and 5/6.  She needs paperwork filled out.  She has returned to work without restrictions.  No lingering nausea, vomiting, abdominal pain.  Past Medical History:  Diagnosis Date  . Anxiety   . Depression   . Seizures (HCC)    "stress induced" in 2016; no meds     Past Surgical History:  Procedure Laterality Date  . NO PAST SURGERIES      Medications  Takes no medications routinely.  Allergies Allergies  Allergen Reactions  . Penicillins Hives, Nausea And Vomiting and Rash  . Pollen Extract Other (See Comments)    Seasonal allergies    Review of Systems: Constitutional:  no unexpected weight changes Eye:  no recent significant change in vision Ear/Nose/Mouth/Throat:  Ears:  no tinnitus or vertigo and no recent change in hearing Nose/Mouth/Throat:  no complaints of nasal congestion, no sore throat Cardiovascular: no chest pain Respiratory:  no cough and no shortness of breath Gastrointestinal:  no abdominal pain, no change in bowel habits GU:  Female: negative for dysuria or pelvic pain Musculoskeletal/Extremities:  no pain of the joints Integumentary (Skin/Breast):  no abnormal skin lesions reported Neurologic:  no headaches Endocrine:  denies fatigue Hematologic/Lymphatic:  No areas of easy bleeding  Exam BP 128/72 (BP Location: Right Arm, Patient Position: Sitting, Cuff Size: Large)   Pulse (!) 55   Temp 98.9 F (37.2 C) (Oral)   Ht 5' 7.5" (1.715 m)    Wt (!) 360 lb (163.3 kg)   LMP 11/08/2020   SpO2 97%   BMI 55.55 kg/m  General:  well developed, well nourished, in no apparent distress Skin:  no significant moles, warts, or growths Head:  no masses, lesions, or tenderness Eyes:  pupils equal and round, sclera anicteric without injection Ears:  canals without lesions, TMs shiny without retraction, no obvious effusion, no erythema Nose:  nares patent, septum midline, mucosa normal, and no drainage or sinus tenderness Throat/Pharynx:  lips and gingiva without lesion; tongue and uvula midline; non-inflamed pharynx; no exudates or postnasal drainage Neck: neck supple without adenopathy, thyromegaly, or masses Lungs:  clear to auscultation, breath sounds equal bilaterally, no respiratory distress Cardio:  regular rate and rhythm, no bruits, no LE edema Abdomen:  abdomen soft, nontender; bowel sounds normal; no masses or organomegaly Genital: Defer to GYN Musculoskeletal:  symmetrical muscle groups noted without atrophy or deformity Extremities:  no clubbing, cyanosis, or edema, no deformities, no skin discoloration Neuro:  gait normal; deep tendon reflexes normal and symmetric Psych: well oriented with normal range of affect and appropriate judgment/insight  Assessment and Plan  Well adult exam - Plan: CBC, Comprehensive metabolic panel, Lipid panel  Morbid obesity (HCC), Chronic - Plan: Amb Ref to Medical Weight Management  Encounter for hepatitis C screening test for low risk patient - Plan: Hepatitis C antibody  Follow-up for resolved condition  Encounter for completion of form with patient   Well 24 y.o.  female. Counseled on diet and exercise. Other orders as above. Refer to medical weight loss team for her weight.  Recommended exercise in addition to meal prepping. Form filled out excusing her from 5/5 and 5/6.  She is already working and should have no further issues stemming from that episode 2.5 weeks ago. Follow up in 1  yr or prn. The patient voiced understanding and agreement to the plan.  Jilda Roche Graham, DO 12/07/20 11:50 AM

## 2020-12-08 LAB — HEPATITIS C ANTIBODY
Hepatitis C Ab: NONREACTIVE
SIGNAL TO CUT-OFF: 0.07 (ref <1.00)

## 2020-12-09 ENCOUNTER — Telehealth: Payer: Self-pay | Admitting: Family Medicine

## 2020-12-09 NOTE — Telephone Encounter (Signed)
FMLA forms faxed into front office   Placed into wendling folder up front

## 2020-12-15 DIAGNOSIS — Z0279 Encounter for issue of other medical certificate: Secondary | ICD-10-CM

## 2020-12-15 NOTE — Telephone Encounter (Signed)
Form completed and faxed in. Sent to scan.

## 2020-12-18 NOTE — Telephone Encounter (Signed)
Completed and faxed. Patient informed.

## 2020-12-18 NOTE — Telephone Encounter (Signed)
Patient brought forms in. Theres a few things that need to be completed/added/changed  Placed into [employer]ling folder  Patient would like it to be refaxed and the called with a conformation

## 2020-12-21 NOTE — Telephone Encounter (Signed)
I still have the paperwork

## 2020-12-21 NOTE — Telephone Encounter (Signed)
Question 5 is incomplete, according to the patient, and the dates should be (11-20-03-07). They also require an actual signature without a stamp. wondering if you still have the paperwork.

## 2020-12-21 NOTE — Telephone Encounter (Signed)
Patient brought in forms to get them re-completed  Makayla Livingston has them

## 2020-12-21 NOTE — Telephone Encounter (Signed)
Fixed. Ty.

## 2020-12-21 NOTE — Telephone Encounter (Signed)
PCP completed and has been faxed. Sent to scan

## 2021-01-16 ENCOUNTER — Encounter (INDEPENDENT_AMBULATORY_CARE_PROVIDER_SITE_OTHER): Payer: Self-pay

## 2021-01-25 ENCOUNTER — Other Ambulatory Visit: Payer: Self-pay

## 2021-01-25 ENCOUNTER — Other Ambulatory Visit (INDEPENDENT_AMBULATORY_CARE_PROVIDER_SITE_OTHER): Payer: BC Managed Care – PPO

## 2021-01-25 DIAGNOSIS — E785 Hyperlipidemia, unspecified: Secondary | ICD-10-CM | POA: Diagnosis not present

## 2021-01-25 LAB — LIPID PANEL
Cholesterol: 225 mg/dL — ABNORMAL HIGH (ref 0–200)
HDL: 43.8 mg/dL (ref >39.00)
LDL Cholesterol: 157 mg/dL — ABNORMAL HIGH (ref 0–99)
NonHDL: 180.87
Total CHOL/HDL Ratio: 5
Triglycerides: 117 mg/dL (ref 0.0–149.0)
VLDL: 23.4 mg/dL (ref 0.0–40.0)

## 2022-02-23 ENCOUNTER — Encounter (INDEPENDENT_AMBULATORY_CARE_PROVIDER_SITE_OTHER): Payer: Self-pay

## 2022-03-15 DIAGNOSIS — I2699 Other pulmonary embolism without acute cor pulmonale: Secondary | ICD-10-CM | POA: Diagnosis not present

## 2022-03-15 DIAGNOSIS — R7989 Other specified abnormal findings of blood chemistry: Secondary | ICD-10-CM | POA: Diagnosis not present

## 2022-03-15 DIAGNOSIS — I361 Nonrheumatic tricuspid (valve) insufficiency: Secondary | ICD-10-CM | POA: Diagnosis not present

## 2022-03-15 DIAGNOSIS — I2609 Other pulmonary embolism with acute cor pulmonale: Secondary | ICD-10-CM | POA: Diagnosis not present

## 2022-03-15 DIAGNOSIS — I493 Ventricular premature depolarization: Secondary | ICD-10-CM | POA: Diagnosis not present

## 2022-03-15 DIAGNOSIS — Z6841 Body Mass Index (BMI) 40.0 and over, adult: Secondary | ICD-10-CM | POA: Diagnosis not present

## 2022-03-15 DIAGNOSIS — Z20822 Contact with and (suspected) exposure to covid-19: Secondary | ICD-10-CM | POA: Diagnosis not present

## 2022-03-15 DIAGNOSIS — Z3202 Encounter for pregnancy test, result negative: Secondary | ICD-10-CM | POA: Diagnosis not present

## 2022-03-15 DIAGNOSIS — Z8659 Personal history of other mental and behavioral disorders: Secondary | ICD-10-CM | POA: Diagnosis not present

## 2022-03-15 DIAGNOSIS — I517 Cardiomegaly: Secondary | ICD-10-CM | POA: Diagnosis not present

## 2022-03-15 DIAGNOSIS — R0602 Shortness of breath: Secondary | ICD-10-CM | POA: Diagnosis not present

## 2022-03-15 DIAGNOSIS — R000 Tachycardia, unspecified: Secondary | ICD-10-CM | POA: Diagnosis not present

## 2022-03-16 DIAGNOSIS — R0602 Shortness of breath: Secondary | ICD-10-CM | POA: Diagnosis not present

## 2022-03-16 DIAGNOSIS — I2609 Other pulmonary embolism with acute cor pulmonale: Secondary | ICD-10-CM | POA: Diagnosis not present

## 2022-03-16 DIAGNOSIS — I493 Ventricular premature depolarization: Secondary | ICD-10-CM | POA: Diagnosis not present

## 2022-03-16 DIAGNOSIS — Z3202 Encounter for pregnancy test, result negative: Secondary | ICD-10-CM | POA: Diagnosis not present

## 2022-03-16 DIAGNOSIS — Z20822 Contact with and (suspected) exposure to covid-19: Secondary | ICD-10-CM | POA: Diagnosis not present

## 2022-03-17 ENCOUNTER — Telehealth: Payer: Self-pay

## 2022-03-17 NOTE — Telephone Encounter (Signed)
Transition Care Management Follow-up Telephone Call Date of discharge and from where: [facility] 03-16-22 Dx: pulmonary embolism  How have you been since you were released from the hospital? Doing better - still a little SHOB but overall better  Any questions or concerns? No  Items Reviewed: Did the pt receive and understand the discharge instructions provided? Yes  Medications obtained and verified? Yes  Other? No  Any new allergies since your discharge? No  Dietary orders reviewed? Yes Do you have support at home? Yes   Home Care and Equipment/Supplies: Were home health services ordered? no If so, what is the name of the agency? na  Has the agency set up a time to come to the patient's home? no Were any new equipment or medical supplies ordered?  No What is the name of the medical supply agency? na Were you able to get the supplies/equipment? not applicable Do you have any questions related to the use of the equipment or supplies? No  Functional Questionnaire: (I = Independent and D = Dependent) ADLs: I  Bathing/Dressing- I  Meal Prep- I  Eating- I  Maintaining continence- I  Transferring/Ambulation- I  Managing Meds- I  Follow up appointments reviewed:  PCP Hospital f/u appt confirmed? Yes  Scheduled to see Dr Carmelia Roller on 03-18-22 @ 1115am. Specialist [facility] f/u appt confirmed? No . Are transportation arrangements needed? No  If their condition worsens, is the pt aware to call PCP or go to the Emergency Dept.? Yes Was the patient provided with contact information for the PCP's office or ED? Yes Was to pt encouraged to call back with questions or concerns? Yes

## 2022-03-18 ENCOUNTER — Encounter: Payer: Self-pay | Admitting: Family Medicine

## 2022-03-18 ENCOUNTER — Ambulatory Visit (INDEPENDENT_AMBULATORY_CARE_PROVIDER_SITE_OTHER): Payer: BC Managed Care – PPO | Admitting: Family Medicine

## 2022-03-18 VITALS — BP 110/72 | HR 85 | Temp 98.0°F | Ht 67.0 in | Wt 346.4 lb

## 2022-03-18 DIAGNOSIS — J302 Other seasonal allergic rhinitis: Secondary | ICD-10-CM | POA: Diagnosis not present

## 2022-03-18 DIAGNOSIS — I2699 Other pulmonary embolism without acute cor pulmonale: Secondary | ICD-10-CM

## 2022-03-18 DIAGNOSIS — Z23 Encounter for immunization: Secondary | ICD-10-CM | POA: Diagnosis not present

## 2022-03-18 DIAGNOSIS — R41840 Attention and concentration deficit: Secondary | ICD-10-CM | POA: Diagnosis not present

## 2022-03-18 MED ORDER — FLUTICASONE PROPIONATE 50 MCG/ACT NA SUSP: 2.0000 | Freq: Every day | NASAL | 6 refills | Status: DC

## 2022-03-18 NOTE — Patient Instructions (Addendum)
If you do not hear anything about your referral in the next 1-2 weeks, call our office and ask for an update.  Aim to do some physical exertion for 150 minutes per week. This is typically divided into 5 days per week, 30 minutes per day. The activity should be enough to get your heart rate up. Anything is better than nothing if you have time constraints.  Let me know when you need a refill of the Eliquis.   Call Center for [facility] at [phone] for an appointment.  They are located at [address], [place], [place], [address] (right across the hall from our office).  I recommend getting the flu shot in mid October. This suggestion would change if the CDC comes out with a different recommendation.   Please consider counseling. Contact [phone] to schedule an appointment or inquire about cost/insurance coverage.  [facility] located at [address], [place], [place].  Phone number = [phone].  Dr. Regan Lemming - Adult Psychiatry.    [facility] located at [address] [place], [place], [place]. Phone number = [phone].   [facility] located at [address], [place], [place].  Phone number = [phone].   [facility] located at [address] [place], [place], [place].  Phone number = [phone].  Let us know if you need anything.

## 2022-03-18 NOTE — Progress Notes (Signed)
Chief Complaint  Patient presents with   Follow-up    ED followup     HPI Makayla Livingston is a 26 y.o. y.o. female who presents for a transition of care visit.  Pt was discharged from [facility] on 8/30.  Within 48 business hours of discharge our office contacted pt via telephone to coordinate care and needs.   Patient was admitted for observation after a right-sided pulmonary embolism was found.  She was admitted on 8/29 and discharged on 8/30.  She is currently in her first week of an Eliquis starter pack and is taking 10 mg twice daily.  She is compliant with this and reports no adverse effects.  Her breathing is much better.  She is not having any coughing.  She never had any swelling in the legs or calf pain.  Echo showed no evidence of hypertrophy.  She denies any personal or family history of blood clots or clotting disorders.  She has not had any recent trauma, prolonged bedrest, travel, or surgical procedures.  She has no history of sleep apnea, smoking, or estrogen use.  Of note, she does have a history of allergies.  She takes a generic over-the-counter oral antihistamine which does help a little.  She does not use a nasal spray.  The patient has a history of anxiety and depression.  She is requesting counseling resources.  She has lots of stress and wonders if her poor concentration is due to this or possibly ADHD.  She notes that her attention issue did not worsen until the anxiety/depression came into play.  She has never had a formal evaluation for this.  Past Medical History:  Diagnosis Date   Anxiety    Depression    Seizures (HCC)    "stress induced" in 2016; no meds   Past Surgical History:  Procedure Laterality Date   NO PAST SURGERIES     Family History  Problem Relation Age of Onset   Hypertension Mother    Cancer Maternal Grandmother        pancreatic cancer    Cancer Maternal Grandfather        colon cancer   Heart disease Neg Hx    Allergies as of 03/18/2022        Reactions   Penicillins Hives, Nausea And Vomiting, Rash   Pollen Extract Other (See Comments)   Seasonal allergies        Medication List        Accurate as of March 18, 2022 12:04 PM. If you have any questions, ask your nurse or doctor.          Apixaban Starter Pack (10mg  and 5mg ) Commonly known as: ELIQUIS STARTER PACK Take 10 mg by mouth in the morning and at bedtime.   fluticasone 50 MCG/ACT nasal spray Commonly known as: FLONASE Place 2 sprays into both nostrils daily. Started by: , DO        ROS:  Constitutional: No fevers or chills, no weight loss HEENT: No headaches, hearing loss, or runny nose, no sore throat Heart: No chest pain Lungs: No SOB, no cough Abd: No bowel changes, no pain, no N/V GU: No urinary complaints Neuro: No numbness, tingling or weakness Msk: No joint or muscle pain  Objective BP 110/72   Pulse 85   Temp 98 F (36.7 C) (Oral)   Ht 5\' 7"  (1.702 m)   Wt (!) 346 lb 6 oz (157.1 kg)   SpO2 99%   BMI  54.25 kg/m  General Appearance:  awake, alert, oriented, in no acute distress and well developed, well nourished Skin:  there are no suspicious lesions or rashes of concern Head/face:  NCAT Eyes:  EOMI, PERRLA Ears:  canals and TMs NI Nose/Sinuses:  negative Mouth/Throat:  Mucosa moist, no lesions; pharynx without erythema, edema or exudate. Neck:  neck- supple, no mass, non-tender and no jvd Lungs: Clear to auscultation.  No rales, rhonchi, or wheezing. Normal effort, no accessory muscle use. Heart:  Heart sounds are normal.  Regular rate and rhythm without murmur, gallop or rub. No bruits. Abdomen:  BS+, soft, NT, ND, no masses or organomegaly Musculoskeletal:  No muscle group atrophy or asymmetry, gait normal Neurologic:  Alert and oriented x 3, gait normal., reflexes normal and symmetric, strength and  sensation grossly normal Psych exam: Nml mood and affect, age appropriate judgment and insight  Acute  pulmonary embolism without acute cor pulmonale, unspecified pulmonary embolism type (HCC) - Plan: Ambulatory referral to Hematology / Oncology  Inattention - Plan: Ambulatory referral to Psychology  Seasonal allergies - Plan: fluticasone (FLONASE) 50 MCG/ACT nasal spray  Need for Tdap vaccination - Plan: Tdap vaccine greater than or equal to 7yo IM  Continue Eliquis 10 mg twice daily for 7 total days and then transitioning to 5 mg twice daily.  She will let me know if she needs refills prior to seeing her specialist.  We will refer her to determine length of anticoagulation and potentially a hypercoagulable work-up as this was unprovoked. Counseling information provided today to see if better control of anxiety/depression helps with her attention.  We will also refer her for formal evaluation for ADHD.  Follow-up pending this. Continue oral antihistamine as needed.  We will add an intranasal corticosteroid daily as needed as well. Tdap today.  Gynecology information provided as she is due for her Pap smear. I will see her in 6 months for physical or as needed. The patient voiced understanding and agreement to the plan.  I spent 45 minutes with the patient discussing the above plans in addition to reviewing her chart, mainly her hospitalization, on the same date of visit.  Jilda Roche [place], DO 03/18/22 12:04 PM

## 2022-03-22 ENCOUNTER — Other Ambulatory Visit: Payer: Self-pay | Admitting: Family

## 2022-03-22 DIAGNOSIS — I2699 Other pulmonary embolism without acute cor pulmonale: Secondary | ICD-10-CM

## 2022-03-22 DIAGNOSIS — D6859 Other primary thrombophilia: Secondary | ICD-10-CM

## 2022-03-23 ENCOUNTER — Inpatient Hospital Stay (HOSPITAL_BASED_OUTPATIENT_CLINIC_OR_DEPARTMENT_OTHER): Payer: BC Managed Care – PPO | Admitting: Family

## 2022-03-23 ENCOUNTER — Inpatient Hospital Stay: Payer: BC Managed Care – PPO | Attending: Hematology & Oncology

## 2022-03-23 ENCOUNTER — Encounter: Payer: Self-pay | Admitting: Family

## 2022-03-23 VITALS — BP 124/75 | HR 85 | Temp 98.3°F | Resp 20 | Ht 67.0 in | Wt 353.0 lb

## 2022-03-23 DIAGNOSIS — R079 Chest pain, unspecified: Secondary | ICD-10-CM

## 2022-03-23 DIAGNOSIS — Z800 Family history of malignant neoplasm of digestive organs: Secondary | ICD-10-CM | POA: Insufficient documentation

## 2022-03-23 DIAGNOSIS — I2699 Other pulmonary embolism without acute cor pulmonale: Secondary | ICD-10-CM | POA: Diagnosis not present

## 2022-03-23 DIAGNOSIS — Z7901 Long term (current) use of anticoagulants: Secondary | ICD-10-CM | POA: Diagnosis not present

## 2022-03-23 DIAGNOSIS — D6859 Other primary thrombophilia: Secondary | ICD-10-CM

## 2022-03-23 LAB — CBC WITH DIFFERENTIAL (CANCER CENTER ONLY)
Abs Immature Granulocytes: 0.01 10*3/uL (ref 0.00–0.07)
Basophils Absolute: 0 10*3/uL (ref 0.0–0.1)
Basophils Relative: 1 %
Eosinophils Absolute: 0.2 10*3/uL (ref 0.0–0.5)
Eosinophils Relative: 3 %
HCT: 37.2 % (ref 36.0–46.0)
Hemoglobin: 11.6 g/dL — ABNORMAL LOW (ref 12.0–15.0)
Immature Granulocytes: 0 %
Lymphocytes Relative: 37 %
Lymphs Abs: 2.4 10*3/uL (ref 0.7–4.0)
MCH: 23.7 pg — ABNORMAL LOW (ref 26.0–34.0)
MCHC: 31.2 g/dL (ref 30.0–36.0)
MCV: 76.1 fL — ABNORMAL LOW (ref 80.0–100.0)
Monocytes Absolute: 0.5 10*3/uL (ref 0.1–1.0)
Monocytes Relative: 8 %
Neutro Abs: 3.4 10*3/uL (ref 1.7–7.7)
Neutrophils Relative %: 51 %
Platelet Count: 316 10*3/uL (ref 150–400)
RBC: 4.89 MIL/uL (ref 3.87–5.11)
RDW: 14.7 % (ref 11.5–15.5)
WBC Count: 6.5 10*3/uL (ref 4.0–10.5)
nRBC: 0 % (ref 0.0–0.2)

## 2022-03-23 LAB — CMP (CANCER CENTER ONLY)
ALT: 9 U/L (ref 0–44)
AST: 11 U/L — ABNORMAL LOW (ref 15–41)
Albumin: 3.7 g/dL (ref 3.5–5.0)
Alkaline Phosphatase: 66 U/L (ref 38–126)
Anion gap: 7 (ref 5–15)
BUN: 16 mg/dL (ref 6–20)
CO2: 25 mmol/L (ref 22–32)
Calcium: 9.3 mg/dL (ref 8.9–10.3)
Chloride: 105 mmol/L (ref 98–111)
Creatinine: 0.93 mg/dL (ref 0.44–1.00)
GFR, Estimated: >60 mL/min (ref >60)
Glucose, Bld: 104 mg/dL — ABNORMAL HIGH (ref 70–99)
Potassium: 3.7 mmol/L (ref 3.5–5.1)
Sodium: 137 mmol/L (ref 135–145)
Total Bilirubin: 0.3 mg/dL (ref 0.3–1.2)
Total Protein: 7.2 g/dL (ref 6.5–8.1)

## 2022-03-23 LAB — D-DIMER, QUANTITATIVE: D-Dimer, Quant: 0.35 ug/mL-FEU (ref 0.00–0.50)

## 2022-03-23 LAB — ANTITHROMBIN III: AntiThromb III Func: 87 % (ref 75–120)

## 2022-03-23 LAB — LACTATE DEHYDROGENASE: LDH: 126 U/L (ref 98–192)

## 2022-03-23 MED ORDER — APIXABAN 5 MG PO TABS: 5.0000 mg | ORAL_TABLET | Freq: Two times a day (BID) | ORAL | 6 refills | Status: DC

## 2022-03-23 NOTE — Progress Notes (Signed)
Hematology/Oncology Consultation   Name: ROSETTE BELLAVANCE      MRN: [id number]    Location: Room/bed info not found  Date: 03/23/2022 Time:11:12 AM   REFERRING PHYSICIAN: [name], DO  REASON FOR CONSULT: Acute pulmonary embolism    DIAGNOSIS: Acute pulmonary embolism, no evidence of right heart strain    HISTORY OF PRESENT ILLNESS: Ms. Bauman is a very pleasant 26 yo African American female with recent diagnosis of pulmonary embolism without evidence of right heart strain.  No prior history of thrombotic event. No known familial history of thrombotic event.  She started her Eliquis starter pack last week and is tolerating nicely.  No blood loss, bruising or petechiae noted.  She does have some fatigue and sleeps a lot. She states that she does snore but sleep apnea testing in the past has been negative.  No hormone therapy/birth control use. She had not done any recent travelling. No injury or illness.  She had her wisdom teeth extracted when she was 26 yo without any complications.  No personal history of cancer. Her maternal grandmother had pancreatic cancer and maternal grandfather had colon cancer.  No history of diabetes or thyroid disease.  No fever, chills, n/v, cough, rash, dizziness, SOB, chest pain, palpitations, abdominal pain or changes in bowel or bladder habits.  No swelling, tenderness, numbness or tingling in her extrmeities.  No falls or syncope.  No smoking or ETOH Marijuana use regularly.   Appetite and hydration are good. Weight stable at 353 lbs.  She is currently in college studying social psychology. She also works in Teacher, early years/pre for [employer].   ROS: All other 10 point review of systems is negative.   PAST MEDICAL HISTORY:   Past Medical History:  Diagnosis Date   Anxiety    Depression    Seizures (HCC)    "stress induced" in 2016; no meds    ALLERGIES: Allergies  Allergen Reactions   Penicillins Hives, Nausea And Vomiting and Rash   Pollen  Extract Other (See Comments)    Seasonal allergies      MEDICATIONS:  Current Outpatient Medications on File Prior to Visit  Medication Sig Dispense Refill   APIXABAN (ELIQUIS) VTE STARTER PACK (10MG  AND 5MG ) Take 10 mg by mouth in the morning and at bedtime.     fluticasone (FLONASE) 50 MCG/ACT nasal spray Place 2 sprays into both nostrils daily. 16 g 6   No current facility-administered medications on file prior to visit.     PAST SURGICAL HISTORY Past Surgical History:  Procedure Laterality Date   NO PAST SURGERIES      FAMILY HISTORY: Family History  Problem Relation Age of Onset   Hypertension Mother    Cancer Maternal Grandmother        pancreatic cancer    Cancer Maternal Grandfather        colon cancer   Heart disease Neg Hx     SOCIAL HISTORY:  reports that she has never smoked. She has never used smokeless tobacco. She reports that she does not drink alcohol and does not use drugs.  PERFORMANCE STATUS: The patient's performance status is 1 - Symptomatic but completely ambulatory  PHYSICAL EXAM: Most Recent Vital Signs: There were no vitals taken for this visit. BP 124/75 (BP Location: Left Wrist, Patient Position: Sitting)   Pulse 85   Temp 98.3 F (36.8 C) (Oral)   Resp 20   Ht 5\' 7"  (1.702 m)   Wt (!) 353 lb (160.1 kg)  SpO2 100%   BMI 55.29 kg/m   General Appearance:    Alert, cooperative, no distress, appears stated age  Head:    Normocephalic, without obvious abnormality, atraumatic  Eyes:    PERRL, conjunctiva/corneas clear, EOM's intact, fundi    benign, both eyes        Throat:   Lips, mucosa, and tongue normal; teeth and gums normal  Neck:   Supple, symmetrical, trachea midline, no adenopathy;    thyroid:  no enlargement/tenderness/nodules; no carotid   bruit or JVD  Back:     Symmetric, no curvature, ROM normal, no CVA tenderness  Lungs:     Clear to auscultation bilaterally, respirations unlabored  Chest Wall:    No tenderness or  deformity   Heart:    Regular rate and rhythm, S1 and S2 normal, no murmur, rub   or gallop     Abdomen:     Soft, non-tender, bowel sounds active all four quadrants,    no masses, no organomegaly        Extremities:   Extremities normal, atraumatic, no cyanosis or edema  Pulses:   2+ and symmetric all extremities  Skin:   Skin color, texture, turgor normal, no rashes or lesions  Lymph nodes:   Cervical, supraclavicular, and axillary nodes normal  Neurologic:   CNII-XII intact, normal strength, sensation and reflexes    throughout    LABORATORY DATA:  Results for orders placed or performed in visit on 03/23/22 (from the past 48 hour(s))  CBC with Differential (Cancer Center Only)     Status: Abnormal   Collection Time: 03/23/22 10:36 AM  Result Value Ref Range   WBC Count 6.5 4.0 - 10.5 K/uL   RBC 4.89 3.87 - 5.11 MIL/uL   Hemoglobin 11.6 (L) 12.0 - 15.0 g/dL   HCT 28.4 13.2 - 44.0 %   MCV 76.1 (L) 80.0 - 100.0 fL   MCH 23.7 (L) 26.0 - 34.0 pg   MCHC 31.2 30.0 - 36.0 g/dL   RDW 10.2 72.5 - 36.6 %   Platelet Count 316 150 - 400 K/uL   nRBC 0.0 0.0 - 0.2 %   Neutrophils Relative % 51 %   Neutro Abs 3.4 1.7 - 7.7 K/uL   Lymphocytes Relative 37 %   Lymphs Abs 2.4 0.7 - 4.0 K/uL   Monocytes Relative 8 %   Monocytes Absolute 0.5 0.1 - 1.0 K/uL   Eosinophils Relative 3 %   Eosinophils Absolute 0.2 0.0 - 0.5 K/uL   Basophils Relative 1 %   Basophils Absolute 0.0 0.0 - 0.1 K/uL   Immature Granulocytes 0 %   Abs Immature Granulocytes 0.01 0.00 - 0.07 K/uL    Comment: Performed at [facility] at [facility], [address] [place], [place] [address]      RADIOGRAPHY: No results found.     PATHOLOGY: None   ASSESSMENT/PLAN: Ms. Cuda is a very pleasant 26 yo [place] American female with recent diagnosis of pulmonary embolism without evidence of right heart strain.  Continue same regimen with Eliquis 5 mg PO BID.  Hyper coag panel pending.   Follow-up in 3 months with repeat CT angio same day.   All questions were answered. The patient knows to call the clinic with any problems, questions or concerns. We can certainly see the patient much sooner if necessary.  The patient was discussed with Dr. Myna Hidalgo and he is in agreement with the aforementioned.   Maralyn Sago  Montez Morita, NP

## 2022-03-24 LAB — CARDIOLIPIN ANTIBODIES, IGG, IGM, IGA
Anticardiolipin IgA: <9 APL U/mL (ref 0–11)
Anticardiolipin IgG: <9 GPL U/mL (ref 0–14)
Anticardiolipin IgM: <9 MPL U/mL (ref 0–12)

## 2022-03-24 LAB — HOMOCYSTEINE: Homocysteine: 15.3 umol/L — ABNORMAL HIGH (ref 0.0–14.5)

## 2022-03-25 LAB — BETA-2-GLYCOPROTEIN I ABS, IGG/M/A
Beta-2 Glyco I IgG: <9 GPI IgG units (ref 0–20)
Beta-2-Glycoprotein I IgA: 10 GPI IgA units (ref 0–25)
Beta-2-Glycoprotein I IgM: 10 GPI IgM units (ref 0–32)

## 2022-03-25 LAB — PROTEIN S ACTIVITY: Protein S Activity: 68 % (ref 63–140)

## 2022-03-25 LAB — LUPUS ANTICOAGULANT PANEL
DRVVT: 81.7 s — ABNORMAL HIGH (ref 0.0–47.0)
PTT Lupus Anticoagulant: 52 s — ABNORMAL HIGH (ref 0.0–43.5)

## 2022-03-25 LAB — PROTEIN C, TOTAL: Protein C, Total: 84 % (ref 60–150)

## 2022-03-25 LAB — PROTEIN S, TOTAL: Protein S Ag, Total: 98 % (ref 60–150)

## 2022-03-25 LAB — HEXAGONAL PHASE PHOSPHOLIPID: Hexagonal Phase Phospholipid: 9 s (ref 0–11)

## 2022-03-25 LAB — PROTEIN C ACTIVITY: Protein C Activity: 108 % (ref 73–180)

## 2022-03-25 LAB — PTT-LA MIX: PTT-LA Mix: 47.9 s — ABNORMAL HIGH (ref 0.0–40.5)

## 2022-03-25 LAB — DRVVT CONFIRM: dRVVT Confirm: 1.3 ratio — ABNORMAL HIGH (ref 0.8–1.2)

## 2022-03-25 LAB — DRVVT MIX: dRVVT Mix: 54 s — ABNORMAL HIGH (ref 0.0–40.4)

## 2022-03-29 LAB — FACTOR 5 LEIDEN

## 2022-03-29 LAB — PROTHROMBIN GENE MUTATION

## 2022-04-01 ENCOUNTER — Other Ambulatory Visit: Payer: Self-pay | Admitting: Family

## 2022-04-01 ENCOUNTER — Telehealth: Payer: Self-pay

## 2022-04-01 ENCOUNTER — Other Ambulatory Visit: Payer: Self-pay | Admitting: *Deleted

## 2022-04-01 DIAGNOSIS — D6859 Other primary thrombophilia: Secondary | ICD-10-CM

## 2022-04-01 DIAGNOSIS — E7211 Homocystinuria: Secondary | ICD-10-CM

## 2022-04-01 DIAGNOSIS — I2699 Other pulmonary embolism without acute cor pulmonale: Secondary | ICD-10-CM

## 2022-04-01 DIAGNOSIS — R760 Raised antibody titer: Secondary | ICD-10-CM

## 2022-04-01 DIAGNOSIS — R079 Chest pain, unspecified: Secondary | ICD-10-CM

## 2022-04-01 MED ORDER — APIXABAN 5 MG PO TABS: 5.0000 mg | ORAL_TABLET | Freq: Two times a day (BID) | ORAL | 6 refills | Status: DC

## 2022-04-01 MED ORDER — FOLIC ACID 1 MG PO TABS: 1.0000 mg | ORAL_TABLET | Freq: Every day | ORAL | 11 refills | Status: DC

## 2022-04-01 NOTE — Telephone Encounter (Signed)
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 The treatment for the later is Folic acid which I sent to her pharmacy 1 mg po daily. She can continue her same regimen with Eliquis! Thank you!  Makayla Livingston    ----- Message ----- From: Leory Plowman, Lab In [place] Sent: 03/23/2022  11:07 AM EDT To: Erenest Blank, NP

## 2022-04-01 NOTE — Telephone Encounter (Signed)
Advised via MyChart.

## 2022-06-22 ENCOUNTER — Encounter (HOSPITAL_BASED_OUTPATIENT_CLINIC_OR_DEPARTMENT_OTHER): Payer: Self-pay

## 2022-06-22 ENCOUNTER — Inpatient Hospital Stay (HOSPITAL_BASED_OUTPATIENT_CLINIC_OR_DEPARTMENT_OTHER): Payer: BC Managed Care – PPO | Admitting: Family

## 2022-06-22 ENCOUNTER — Ambulatory Visit (HOSPITAL_BASED_OUTPATIENT_CLINIC_OR_DEPARTMENT_OTHER)
Admission: RE | Admit: 2022-06-22 | Discharge: 2022-06-22 | Disposition: A | Discharge status: Home / Self Care | Payer: BC Managed Care – PPO | Source: Ambulatory Visit | Attending: Family | Admitting: Family

## 2022-06-22 ENCOUNTER — Inpatient Hospital Stay: Payer: BC Managed Care – PPO | Attending: Hematology & Oncology

## 2022-06-22 VITALS — BP 128/70 | HR 91 | Temp 98.2°F | Resp 20 | Ht 67.0 in | Wt 359.0 lb

## 2022-06-22 DIAGNOSIS — E7211 Homocystinuria: Secondary | ICD-10-CM

## 2022-06-22 DIAGNOSIS — Z7901 Long term (current) use of anticoagulants: Secondary | ICD-10-CM | POA: Insufficient documentation

## 2022-06-22 DIAGNOSIS — I2699 Other pulmonary embolism without acute cor pulmonale: Secondary | ICD-10-CM | POA: Diagnosis not present

## 2022-06-22 DIAGNOSIS — R7989 Other specified abnormal findings of blood chemistry: Secondary | ICD-10-CM | POA: Diagnosis not present

## 2022-06-22 DIAGNOSIS — D6859 Other primary thrombophilia: Secondary | ICD-10-CM

## 2022-06-22 DIAGNOSIS — R079 Chest pain, unspecified: Secondary | ICD-10-CM | POA: Insufficient documentation

## 2022-06-22 DIAGNOSIS — R760 Raised antibody titer: Secondary | ICD-10-CM

## 2022-06-22 DIAGNOSIS — D6862 Lupus anticoagulant syndrome: Secondary | ICD-10-CM | POA: Diagnosis not present

## 2022-06-22 DIAGNOSIS — R7983 Abnormal findings of blood amino-acid level: Secondary | ICD-10-CM | POA: Insufficient documentation

## 2022-06-22 LAB — CMP (CANCER CENTER ONLY)
ALT: 10 U/L (ref 0–44)
AST: 12 U/L — ABNORMAL LOW (ref 15–41)
Albumin: 3.8 g/dL (ref 3.5–5.0)
Alkaline Phosphatase: 63 U/L (ref 38–126)
Anion gap: 7 (ref 5–15)
BUN: 15 mg/dL (ref 6–20)
CO2: 25 mmol/L (ref 22–32)
Calcium: 9 mg/dL (ref 8.9–10.3)
Chloride: 107 mmol/L (ref 98–111)
Creatinine: 0.84 mg/dL (ref 0.44–1.00)
GFR, Estimated: >60 mL/min (ref >60)
Glucose, Bld: 105 mg/dL — ABNORMAL HIGH (ref 70–99)
Potassium: 3.7 mmol/L (ref 3.5–5.1)
Sodium: 139 mmol/L (ref 135–145)
Total Bilirubin: 0.3 mg/dL (ref 0.3–1.2)
Total Protein: 7.2 g/dL (ref 6.5–8.1)

## 2022-06-22 LAB — CBC WITH DIFFERENTIAL (CANCER CENTER ONLY)
Abs Immature Granulocytes: 0.01 10*3/uL (ref 0.00–0.07)
Basophils Absolute: 0 10*3/uL (ref 0.0–0.1)
Basophils Relative: 0 %
Eosinophils Absolute: 0.1 10*3/uL (ref 0.0–0.5)
Eosinophils Relative: 2 %
HCT: 37.1 % (ref 36.0–46.0)
Hemoglobin: 11.4 g/dL — ABNORMAL LOW (ref 12.0–15.0)
Immature Granulocytes: 0 %
Lymphocytes Relative: 41 %
Lymphs Abs: 2.7 10*3/uL (ref 0.7–4.0)
MCH: 23.3 pg — ABNORMAL LOW (ref 26.0–34.0)
MCHC: 30.7 g/dL (ref 30.0–36.0)
MCV: 75.9 fL — ABNORMAL LOW (ref 80.0–100.0)
Monocytes Absolute: 0.5 10*3/uL (ref 0.1–1.0)
Monocytes Relative: 8 %
Neutro Abs: 3.2 10*3/uL (ref 1.7–7.7)
Neutrophils Relative %: 49 %
Platelet Count: 340 10*3/uL (ref 150–400)
RBC: 4.89 MIL/uL (ref 3.87–5.11)
RDW: 14.7 % (ref 11.5–15.5)
WBC Count: 6.6 10*3/uL (ref 4.0–10.5)
nRBC: 0 % (ref 0.0–0.2)

## 2022-06-22 LAB — D-DIMER, QUANTITATIVE: D-Dimer, Quant: 0.29 ug/mL-FEU (ref 0.00–0.50)

## 2022-06-22 LAB — LACTATE DEHYDROGENASE: LDH: 127 U/L (ref 98–192)

## 2022-06-22 MED ORDER — IOHEXOL 350 MG/ML SOLN
100.0000 mL | Freq: Once | INTRAVENOUS | Status: AC | PRN
  Administered 2022-06-22: 100 mL via INTRAVENOUS

## 2022-06-22 NOTE — Progress Notes (Signed)
Hematology and Oncology Follow Up Visit  Makayla Livingston [id number] 04-08-1996 26 y.o. 06/22/2022   Principle Diagnosis:  Acute pulmonary embolism, no evidence of right heart strain  Positive lupus anticoagulant Hyper homocystinemia   Current Therapy:   Eliquis 5 mg PO BID Folic acid 1 mg PO daily   Interim History:  Makayla Livingston is here today for follow-up. She is doing well and has no complaints at this time.  CT angio repeated this morning. Results pending.  She had some bright red blood in her stool for a couple days recently. She had some straining and plans to try a stool softener.  No other blood loss noted. No bruising or petechiae.  No fever, chills, n/v, cough, rash, dizziness, SOB, chest pain, palpitations, abdominal pain or changes in bowel or bladder habits.  No swelling, tenderness, numbness or tingling in her extremities.  No falls or syncope.  Appetite comes and goes. She typically eats one meal a day. Hydration is good. Weight is stable at 359 lbs.   ECOG Performance Status: 1 - Symptomatic but completely ambulatory  Medications:  Allergies as of 06/22/2022       Reactions   Penicillins Hives, Nausea And Vomiting, Rash   Pollen Extract Other (See Comments)   Seasonal allergies        Medication List        Accurate as of June 22, 2022 10:15 AM. If you have any questions, ask your nurse or doctor.          apixaban 5 MG Tabs tablet Commonly known as: ELIQUIS Take 1 tablet (5 mg total) by mouth 2 (two) times daily.   folic acid 1 MG tablet Commonly known as: FOLVITE Take 1 tablet (1 mg total) by mouth daily.        Allergies:  Allergies  Allergen Reactions   Penicillins Hives, Nausea And Vomiting and Rash   Pollen Extract Other (See Comments)    Seasonal allergies    Past Medical History, Surgical history, Social history, and Family History were reviewed and updated.  Review of Systems: All other 10 point review of systems is  negative.   Physical Exam:  height is 5\' 7"  (1.702 m) and weight is 359 lb (162.8 kg) (abnormal). Her oral temperature is 98.2 F (36.8 C). Her blood pressure is 128/70 and her pulse is 91. Her respiration is 20 and oxygen saturation is 99%.   Wt Readings from Last 3 Encounters:  06/22/22 (!) 359 lb (162.8 kg)  03/23/22 (!) 353 lb (160.1 kg)  03/18/22 (!) 346 lb 6 oz (157.1 kg)    Ocular: Sclerae unicteric, pupils equal, round and reactive to light Ear-nose-throat: Oropharynx clear, dentition fair Lymphatic: No cervical or supraclavicular adenopathy Lungs no rales or rhonchi, good excursion bilaterally Heart regular rate and rhythm, no murmur appreciated Abd soft, nontender, positive bowel sounds MSK no focal spinal tenderness, no joint edema Neuro: non-focal, well-oriented, appropriate affect Breasts: Deferred   Lab Results  Component Value Date   WBC 6.6 06/22/2022   HGB 11.4 (L) 06/22/2022   HCT 37.1 06/22/2022   MCV 75.9 (L) 06/22/2022   PLT 340 06/22/2022   Lab Results  Component Value Date   FERRITIN 23.2 12/07/2020   IRON 43 12/07/2020   TIBC 270 12/29/2014   UIBC 195 12/29/2014   IRONPCTSAT 11.1 (L) 12/07/2020   Lab Results  Component Value Date   RETICCTPCT 1.5 12/29/2014   RBC 4.89 06/22/2022   No results found for: "  KPAFRELGTCHN", "LAMBDASER", "KAPLAMBRATIO" No results found for: "IGGSERUM", "IGA", "IGMSERUM" No results found for: "TOTALPROTELP", "ALBUMINELP", "A1GS", "A2GS", "BETS", "BETA2SER", "GAMS", "MSPIKE", "SPEI"   Chemistry      Component Value Date/Time   NA 139 06/22/2022 0902   K 3.7 06/22/2022 0902   CL 107 06/22/2022 0902   CO2 25 06/22/2022 0902   BUN 15 06/22/2022 0902   CREATININE 0.84 06/22/2022 0902   CREATININE 0.78 01/08/2015 0948      Component Value Date/Time   CALCIUM 9.0 06/22/2022 0902   ALKPHOS 63 06/22/2022 0902   AST 12 (L) 06/22/2022 0902   ALT 10 06/22/2022 0902   BILITOT 0.3 06/22/2022 0902       Impression  and Plan: Makayla Livingston is a very pleasant 26 yo African American female with recent diagnosis of pulmonary embolism without evidence of right heart strain.  She was positive for the lupus anticoagulant and had and elevated homocysteine level.  Continue same regimen with folic acid daily and Eliquis 5 mg PO BID.  Follow-up in 6 months.   Eileen Stanford, NP 12/6/202310:15 AM

## 2022-06-23 LAB — HOMOCYSTEINE: Homocysteine: 13.4 umol/L (ref 0.0–14.5)

## 2022-06-24 LAB — PTT-LA MIX: PTT-LA Mix: 41.6 s — ABNORMAL HIGH (ref 0.0–40.5)

## 2022-06-24 LAB — LUPUS ANTICOAGULANT PANEL
DRVVT: 39.1 s (ref 0.0–47.0)
PTT Lupus Anticoagulant: 44.1 s — ABNORMAL HIGH (ref 0.0–43.5)

## 2022-06-24 LAB — HEXAGONAL PHASE PHOSPHOLIPID: Hexagonal Phase Phospholipid: 5 s (ref 0–11)

## 2022-09-26 ENCOUNTER — Other Ambulatory Visit: Payer: Self-pay | Admitting: Family Medicine

## 2022-09-26 ENCOUNTER — Encounter: Payer: Self-pay | Admitting: Family Medicine

## 2022-09-26 ENCOUNTER — Other Ambulatory Visit (INDEPENDENT_AMBULATORY_CARE_PROVIDER_SITE_OTHER): Payer: BC Managed Care – PPO

## 2022-09-26 ENCOUNTER — Ambulatory Visit (INDEPENDENT_AMBULATORY_CARE_PROVIDER_SITE_OTHER): Payer: BC Managed Care – PPO | Admitting: Family Medicine

## 2022-09-26 VITALS — BP 120/80 | HR 101 | Temp 98.4°F | Ht 67.0 in | Wt 371.0 lb

## 2022-09-26 DIAGNOSIS — F411 Generalized anxiety disorder: Secondary | ICD-10-CM | POA: Diagnosis not present

## 2022-09-26 DIAGNOSIS — Z0000 Encounter for general adult medical examination without abnormal findings: Secondary | ICD-10-CM | POA: Diagnosis not present

## 2022-09-26 DIAGNOSIS — F321 Major depressive disorder, single episode, moderate: Secondary | ICD-10-CM | POA: Diagnosis not present

## 2022-09-26 DIAGNOSIS — R41840 Attention and concentration deficit: Secondary | ICD-10-CM

## 2022-09-26 DIAGNOSIS — R739 Hyperglycemia, unspecified: Secondary | ICD-10-CM

## 2022-09-26 DIAGNOSIS — R7989 Other specified abnormal findings of blood chemistry: Secondary | ICD-10-CM

## 2022-09-26 LAB — CBC
HCT: 34.8 % — ABNORMAL LOW (ref 36.0–46.0)
Hemoglobin: 11.4 g/dL — ABNORMAL LOW (ref 12.0–15.0)
MCHC: 32.7 g/dL (ref 30.0–36.0)
MCV: 74.1 fl — ABNORMAL LOW (ref 78.0–100.0)
Platelets: 368 10*3/uL (ref 150.0–400.0)
RBC: 4.7 Mil/uL (ref 3.87–5.11)
RDW: 15.5 % (ref 11.5–15.5)
WBC: 6.5 10*3/uL (ref 4.0–10.5)

## 2022-09-26 LAB — HEMOGLOBIN A1C: Hgb A1c MFr Bld: 5.6 % (ref 4.6–6.5)

## 2022-09-26 LAB — COMPREHENSIVE METABOLIC PANEL
ALT: 10 U/L (ref 0–35)
AST: 11 U/L (ref 0–37)
Albumin: 3.4 g/dL — ABNORMAL LOW (ref 3.5–5.2)
Alkaline Phosphatase: 70 U/L (ref 39–117)
BUN: 17 mg/dL (ref 6–23)
CO2: 26 mEq/L (ref 19–32)
Calcium: 9.2 mg/dL (ref 8.4–10.5)
Chloride: 107 mEq/L (ref 96–112)
Creatinine, Ser: 0.89 mg/dL (ref 0.40–1.20)
GFR: 88.78 mL/min (ref >60.00)
Glucose, Bld: 108 mg/dL — ABNORMAL HIGH (ref 70–99)
Potassium: 3.9 mEq/L (ref 3.5–5.1)
Sodium: 140 mEq/L (ref 135–145)
Total Bilirubin: 0.2 mg/dL (ref 0.2–1.2)
Total Protein: 6.4 g/dL (ref 6.0–8.3)

## 2022-09-26 LAB — LIPID PANEL
Cholesterol: 196 mg/dL (ref 0–200)
HDL: 50.1 mg/dL (ref >39.00)
LDL Cholesterol: 128 mg/dL — ABNORMAL HIGH (ref 0–99)
NonHDL: 145.99
Total CHOL/HDL Ratio: 4
Triglycerides: 90 mg/dL (ref 0.0–149.0)
VLDL: 18 mg/dL (ref 0.0–40.0)

## 2022-09-26 MED ORDER — ESCITALOPRAM OXALATE 5 MG PO TABS: 5.0000 mg | ORAL_TABLET | Freq: Every day | ORAL | 2 refills | Status: AC

## 2022-09-26 NOTE — Patient Instructions (Addendum)
Give [place] 2-3 business days to get the results of your labs back.   Keep the diet clean and stay active.  Please get me a copy of your advanced directive form at your convenience.   Call Center for [place] at [facility] at [phone] for an appointment.  They are located at [address], [address], [place], [place], [address] (right across the hall from our office).  If you do not hear anything about your referral in the next 1-2 weeks, call our office and ask for an update.  Let us know if you need anything.  Please consider counseling. Contact [phone] to schedule an appointment or inquire about cost/insurance coverage.  [facility] located at [place], [place], [place].  Phone number = [phone].  Dr. Lennice Sites - Adult Psychiatry.    [facility] located at [place], [place], [place]. Phone number = [phone].   [facility] located at [address] [place], [place].  Phone number = [phone].   [facility] located at [place], [place], [place].  Phone number = [phone].

## 2022-09-26 NOTE — Progress Notes (Signed)
Chief Complaint  Patient presents with   Annual Exam    Depression and problems concentrating      Well Woman Makayla Livingston is here for a complete physical.   Her last physical was >1 year ago.  Current diet: in general, diet could be better. Current exercise: none. Weight: Increasing Fatigue out of ordinary? No Seatbelt? Yes Advanced directive? No  Health Maintenance Pap/HPV- Due Tetanus- Yes HIV screening- Yes Hep C screening- Yes  GAD/depression Patient has a multiyear history of anxiety/depression with a predominance towards anxiety.  She is going to school and working full-time over the past couple years which is stressful.  She used to be on Prozac, trazodone, and Lamictal.  She is not following with a therapist right now.  Exercise as above.  No homicidal/suicidal ideation, no self-medication.  Past Medical History:  Diagnosis Date   Anxiety    Depression    Seizures ([place])    "stress induced" in 2016; no meds     Past Surgical History:  Procedure Laterality Date   NO PAST SURGERIES      Medications  Takes no meds routinely.     Allergies Allergies  Allergen Reactions   Penicillins Hives, Nausea And Vomiting and Rash   Pollen Extract Other (See Comments)    Seasonal allergies    Review of Systems: Constitutional:  no unexpected weight changes Eye:  no recent significant change in vision Ear/Nose/Mouth/Throat:  Ears:  no tinnitus or vertigo and no recent change in hearing Nose/Mouth/Throat:  no complaints of nasal congestion, no sore throat Cardiovascular: no chest pain Respiratory:  no cough and no shortness of breath Gastrointestinal:  no abdominal pain, no change in bowel habits GU:  Female: negative for dysuria or pelvic pain Musculoskeletal/Extremities:  no pain of the joints Integumentary (Skin/Breast):  no abnormal skin lesions reported Neurologic:  no headaches Endocrine:  denies fatigue Hematologic/Lymphatic:  No areas of easy  bleeding  Exam BP 120/80 (BP Location: Right Arm, Patient Position: Sitting, Cuff Size: Large)   Pulse (!) 101   Temp 98.4 F (36.9 C) (Oral)   Ht '5\' 7"'$  (1.702 m)   Wt (!) 371 lb (168.3 kg)   SpO2 99%   BMI 58.11 kg/m  General:  well developed, well nourished, in no apparent distress Skin:  no significant moles, warts, or growths Head:  no masses, lesions, or tenderness Eyes:  pupils equal and round, sclera anicteric without injection Ears:  canals without lesions, TMs shiny without retraction, no obvious effusion, no erythema Nose:  nares patent, mucosa normal, and no drainage  Throat/Pharynx:  lips and gingiva without lesion; tongue and uvula midline; non-inflamed pharynx; no exudates or postnasal drainage Neck: neck supple without adenopathy, thyromegaly, or masses Lungs:  clear to auscultation, breath sounds equal bilaterally, no respiratory distress Cardio:  regular rate and rhythm, no bruits, no LE edema Abdomen:  abdomen soft, nontender; bowel sounds normal; no masses or organomegaly Genital: Defer to GYN Musculoskeletal:  symmetrical muscle groups noted without atrophy or deformity Extremities:  no clubbing, cyanosis, or edema, no deformities, no skin discoloration Neuro:  gait normal; deep tendon reflexes normal and symmetric Psych: well oriented with normal range of affect and appropriate judgment/insight  Assessment and Plan  Well adult exam - Plan: CBC, Comprehensive metabolic panel, Lipid panel  Difficulty concentrating - Plan: Ambulatory referral to Psychology  Current moderate episode of major depressive disorder without prior episode ([place]) - Plan: escitalopram (LEXAPRO) 5 MG tablet  Generalized anxiety disorder -  Plan: escitalopram (LEXAPRO) 5 MG tablet   Well 27 y.o. female. Counseled on diet and exercise. Advanced directive form provided today.  Referred for ADHD evaluation, could be due to anxiety/depression. Depression: Chronic, uncontrolled.  Start  Lexapro 5 mg daily, counseling information provided.  Follow-up in 6 weeks to recheck. Other orders as above. The patient voiced understanding and agreement to the plan.  [place], DO 09/26/22 9:27 AM

## 2022-09-27 ENCOUNTER — Other Ambulatory Visit: Payer: Self-pay | Admitting: Family Medicine

## 2022-09-27 DIAGNOSIS — E611 Iron deficiency: Secondary | ICD-10-CM

## 2022-09-27 LAB — IRON,TIBC AND FERRITIN PANEL
%SAT: 14 % (calc) — ABNORMAL LOW (ref 16–45)
Ferritin: 15 ng/mL — ABNORMAL LOW (ref 16–154)
Iron: 43 ug/dL (ref 40–190)
TIBC: 312 mcg/dL (calc) (ref 250–450)

## 2022-09-30 DIAGNOSIS — R079 Chest pain, unspecified: Secondary | ICD-10-CM | POA: Diagnosis not present

## 2022-09-30 DIAGNOSIS — R059 Cough, unspecified: Secondary | ICD-10-CM | POA: Diagnosis not present

## 2022-09-30 DIAGNOSIS — R0602 Shortness of breath: Secondary | ICD-10-CM | POA: Diagnosis not present

## 2022-09-30 DIAGNOSIS — Z86711 Personal history of pulmonary embolism: Secondary | ICD-10-CM | POA: Diagnosis not present

## 2022-09-30 DIAGNOSIS — J181 Lobar pneumonia, unspecified organism: Secondary | ICD-10-CM | POA: Diagnosis not present

## 2022-10-31 ENCOUNTER — Other Ambulatory Visit: Payer: BC Managed Care – PPO

## 2022-11-07 ENCOUNTER — Ambulatory Visit: Payer: BC Managed Care – PPO | Admitting: Family Medicine

## 2022-11-12 ENCOUNTER — Encounter (HOSPITAL_BASED_OUTPATIENT_CLINIC_OR_DEPARTMENT_OTHER): Payer: Self-pay | Admitting: Emergency Medicine

## 2022-11-12 DIAGNOSIS — R059 Cough, unspecified: Secondary | ICD-10-CM | POA: Diagnosis not present

## 2022-11-12 DIAGNOSIS — U071 COVID-19: Secondary | ICD-10-CM | POA: Diagnosis not present

## 2022-11-12 DIAGNOSIS — R000 Tachycardia, unspecified: Secondary | ICD-10-CM | POA: Diagnosis not present

## 2022-11-12 LAB — RESP PANEL BY RT-PCR (RSV, FLU A&B, COVID)  RVPGX2
Influenza A by PCR: NEGATIVE
Influenza B by PCR: NEGATIVE
Resp Syncytial Virus by PCR: NEGATIVE
SARS Coronavirus 2 by RT PCR: POSITIVE — AB

## 2022-11-12 NOTE — ED Triage Notes (Signed)
Pt reports cough, SHOB since yesterday; had PNA last month

## 2022-11-13 ENCOUNTER — Emergency Department (HOSPITAL_BASED_OUTPATIENT_CLINIC_OR_DEPARTMENT_OTHER)
Admission: EM | Admit: 2022-11-13 | Discharge: 2022-11-13 | Disposition: A | Discharge status: Home / Self Care | Payer: BC Managed Care – PPO | Attending: Emergency Medicine | Admitting: Emergency Medicine

## 2022-11-13 DIAGNOSIS — U071 COVID-19: Secondary | ICD-10-CM

## 2022-11-13 HISTORY — DX: Pneumonia, unspecified organism: J18.9

## 2022-11-13 HISTORY — DX: Other pulmonary embolism without acute cor pulmonale: I26.99

## 2022-11-13 MED ORDER — MOLNUPIRAVIR EUA 200MG CAPSULE: 4.0000 | ORAL_CAPSULE | Freq: Two times a day (BID) | ORAL | 0 refills | Status: AC

## 2022-11-13 NOTE — Discharge Instructions (Signed)
Quarantine at home for the next 5 days.  Begin taking molnupiravir as prescribed.  Return to the emergency department for worsening breathing, severe chest pain, or for other new and concerning symptoms.

## 2022-11-13 NOTE — ED Provider Notes (Addendum)
[place] [facility] Provider Note   CSN: [id number] Arrival date & time: 11/12/22  2226     History  Chief Complaint  Patient presents with   Cough   Shortness of Breath    Makayla Livingston is a 27 y.o. female.  Patient is a 27 year old female with past medical history of prior pulmonary embolism off of Eliquis.  Patient presenting today with a 2-day history of congestion and cough.  This started yesterday while she was at work.  She describes sputum production.  She denies any chest pain or difficulty breathing.  She denies fevers or chills.  The history is provided by the patient.       Home Medications Prior to Admission medications   Medication Sig Start Date End Date Taking? Authorizing Provider  escitalopram (LEXAPRO) 5 MG tablet Take 1 tablet (5 mg total) by mouth daily. 09/26/22   Sharlene Dory, DO      Allergies    Penicillins and Pollen extract    Review of Systems   Review of Systems  All other systems reviewed and are negative.   Physical Exam Updated Vital Signs BP (!) 137/59 (BP Location: Left Arm)   Pulse (!) 109   Temp 99.8 F (37.7 C) (Oral)   Resp 20   Ht 5\' 7"  (1.702 m)   Wt (!) 168.3 kg   LMP 10/20/2022   SpO2 100%   BMI 58.11 kg/m  Physical Exam Vitals and nursing note reviewed.  Constitutional:      General: She is not in acute distress.    Appearance: She is well-developed. She is not diaphoretic.  HENT:     Head: Normocephalic and atraumatic.  Cardiovascular:     Rate and Rhythm: Normal rate and regular rhythm.     Heart sounds: No murmur heard.    No friction rub. No gallop.  Pulmonary:     Effort: Pulmonary effort is normal. No respiratory distress.     Breath sounds: Normal breath sounds. No wheezing.  Abdominal:     General: Bowel sounds are normal. There is no distension.     Palpations: Abdomen is soft.     Tenderness: There is no abdominal tenderness.  Musculoskeletal:         General: Normal range of motion.     Cervical back: Normal range of motion and neck supple.  Skin:    General: Skin is warm and dry.  Neurological:     General: No focal deficit present.     Mental Status: She is alert and oriented to person, place, and time.     ED Results / Procedures / Treatments   Labs (all labs ordered are listed, but only abnormal results are displayed) Labs Reviewed  RESP PANEL BY RT-PCR (RSV, FLU A&B, COVID)  [id number] - Abnormal; Notable for the following components:      Result Value   SARS Coronavirus 2 by RT PCR POSITIVE (*)    All other components within normal limits    EKG None  Radiology No results found.  Procedures Procedures    Medications Ordered in ED Medications - No data to display  ED Course/ Medical Decision Making/ A&P  Patient presenting with chest congestion and cough for the past 2 days.  Cough has been productive of yellow sputum.  Her COVID test here today is positive.  This is consistent with the nature of her symptoms and do not feel as though she needs  further workup into other underlying pathology.  She does have a history of pulmonary embolism, however that does not fit the clinical picture and her symptomatology is different from what she experienced with her prior PE.  Final Clinical Impression(s) / ED Diagnoses Final diagnoses:  None    Rx / DC Orders ED Discharge Orders     None         Geoffery Lyons, MD 11/13/22 0126    Geoffery Lyons, MD 11/13/22 [phone]

## 2022-12-23 ENCOUNTER — Inpatient Hospital Stay: Payer: BC Managed Care – PPO | Attending: Hematology & Oncology

## 2022-12-23 ENCOUNTER — Inpatient Hospital Stay: Payer: BC Managed Care – PPO | Admitting: Family

## 2023-01-23 DIAGNOSIS — L910 Hypertrophic scar: Secondary | ICD-10-CM | POA: Diagnosis not present

## 2023-01-23 DIAGNOSIS — L408 Other psoriasis: Secondary | ICD-10-CM | POA: Diagnosis not present

## 2023-01-23 DIAGNOSIS — L089 Local infection of the skin and subcutaneous tissue, unspecified: Secondary | ICD-10-CM | POA: Diagnosis not present

## 2023-01-23 DIAGNOSIS — L732 Hidradenitis suppurativa: Secondary | ICD-10-CM | POA: Diagnosis not present

## 2023-02-08 NOTE — Progress Notes (Addendum)
Date: 02/15/2023 Appointment Start Time: 9:03am Duration: 107 minutes Provider: Helmut Muster, PsyD Type of Session: Initial Appointment for Evaluation  Location of Patient: Home Location of Provider: Provider's Home (private office) Type of Contact: Caregility video visit with audio  Session Content:  Prior to proceeding with today's appointment, two pieces of identifying information were obtained from Makayla Livingston to verify identity. In addition, Makayla Livingston's physical location at the time of this appointment was obtained. In the event of technical difficulties, Makayla Livingston shared a phone number Makayla Livingston could be reached at. [place] and this provider participated in today's telepsychological service. Makayla Livingston denied anyone else being present in the room or on the virtual appointment.  The provider's role was explained to Makayla Livingston. The provider reviewed and discussed issues of confidentiality, privacy, and limits therein (e.g., reporting obligations). In addition to verbal informed consent, written informed consent for psychological services was obtained from [name] prior to the initial appointment. Written consent included information concerning the practice, financial arrangements, and confidentiality and patients' rights. Since the clinic is not a 24/7 crisis center, mental health emergency resources were shared, and the provider explained e-mail, voicemail, and/or other messaging systems should be utilized only for non-emergency reasons. This provider also explained that information obtained during appointments will be placed in their electronic medical record in a confidential manner. Makayla Livingston verbally acknowledged understanding of the aforementioned and agreed to use mental health emergency resources discussed if needed. Moreover, Makayla Livingston agreed information may be shared with other [facility] or their referring provider(s) as needed for coordination of care. By signing the new patient documents, Makayla Livingston provided written consent  for coordination of care. Makayla Livingston verbally acknowledged understanding Makayla Livingston is ultimately responsible for understanding her insurance benefits as it relates to reimbursement of telepsychological and in-person services. This provider also reviewed confidentiality, as it relates to telepsychological services, as well as the rationale for telepsychological services. This provider further explained that video should not be captured, photos should not be taken, nor should testing stimuli be copied or recorded as it would be a copyright violation. Makayla Livingston expressed understanding of the aforementioned, and verbally consented to proceed. Makayla Livingston is aware of the limitations of teleheath visits and verbally consented to proceed.  Makayla Livingston completed the neurobehavioral examination, which included obtaining a, family, social, and psychiatric history; integration of prior history and other sources of clinical data to assist with clinical decision making; behavioral observations; assessment of thinking, reasoning, and judgment; and establishment of a provisional diagnosis. The evaluation was completed in 107 minutes. Codes 78295 and [id number] were billed.   Mental Status Examination:  Appearance:  neat Behavior: appropriate to circumstances Mood: neutral Affect: mood congruent Speech: pressured Eye Contact: appropriate Psychomotor Activity: WNL Thought Process: denies suicidal, homicidal, and self-harm ideation, plan and intent Content/Perceptual Disturbances: none Orientation: AAOx4 Cognition/Sensorium: intact Insight: good Judgment: good  Provisional DSM-5 diagnosis(es):  F89 Unspecified Disorder of Psychological Development   Plan: Testing is expected to answer the question, does the individual meet criteria for ADHD when age, other mental health concerns (e.g., anxiety, depression, and trauma), and cognitive functioning are taken into consideration? Further testing is warranted because a diagnosis cannot be given  solely based on current interview data (further data is required). Testing results are expected to answer the remaining diagnostic questions in order to provide an accurate diagnosis and assist in treatment planning with an expectation of improved clinical outcome. [name] is currently scheduled for an appointment on 03/02/2023 at 8am via Caregility video visit with audio.    CONFIDENTIAL PSYCHOLOGICAL  EVALUATION ______________________________________________________________________________ Name: Makayla Livingston   Date of Birth: [birth year]    Age: 63  SOURCE AND REASON FOR REFERRAL: Makayla Livingston was referred by Dr. [name] for an evaluation to ascertain if Makayla Livingston meets criteria for Attention Deficit/Hyperactivity Disorder (ADHD).   BACKGROUND INFORMATION AND PRESENTING PROBLEM: Makayla Livingston is a 27 year old female who resides in [place].  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 Makayla Livingston added her cognition and mood were further exacerbated two years later upon having started her first employment position. Makayla Livingston described the following ADHD-related concerns as occurring often: being easily distracted by various stimuli (e.g., thoughts, phone, and sounds) and her mind is often elsewhere despite no obvious distractions, noting the thoughts Makayla Livingston is often distracted by are "usually a bad memory;" trouble sustaining her attention during conversations; task initiation issues that Makayla Livingston described as putting off tasks until last minute as Makayla Livingston regularly does not have "drive  or energy" after workdays; task maintenance problems that Makayla Livingston attributed to inattention; periods of hyperfocus with dyschronometria on tasks; being prone to making careless mistakes and missing small details that can cause an extended time to be required to complete tasks; disorganization (e.g., describing her mind as "very jumbled," having trouble determining how to complete multistep tasks, and regularly having clutter in her environment); difficulty making and sticking to plans, adding her mind is "jumbled," planning causes cognitive exhaustion while trying to "track" various details of plans, and Makayla Livingston commonly relies on others to plan;  forgetfulness "all the time," which contributes to her misplacing items, not paying bills, and not remembering scheduled appointments; talking excessively as Makayla Livingston has an urge to "get all of the information out;" interrupting others when Makayla Livingston believes Makayla Livingston knows the question they are asking but they "go on and on;" internal restlessness that Makayla Livingston described as a "rush feeling," noting it can occur secondary to "panic," "anxiety," or seemingly randomly; trouble waiting that Makayla Livingston described as getting "very angry," "agitated," and "exhausted" when required to wait, and avoiding lines by going to places during less busy times and leaving places even if the task was important if Makayla Livingston perceives the wait as too long; tending to drive much faster than others as Makayla Livingston dislikes driving and wants to get to her destination quickly, which has resulted in two speeding tickets over the past two years (one for going and the other for going over the speed limit); tending to jump into tasks without reading or listening to the directions carefully and not following proper order or sequence of tasks, which can lead to an extended time being required to complete tasks; difficulty following through on promises or commitments if Makayla Livingston "did not write them down;" and feeling irritable, agitated, or  overwhelmed when required to wait, Makayla Livingston perceives others as doing something incorrectly, having tasks Makayla Livingston "ha[s] to do," someone breaks her attention from a task, multiple people attempt to talk to her at one time, or when in environments with lots of stimuli. Makayla Livingston described the following ADHD-related concerns as occurring occasionally: fidgeting and squirming (e.g., using a fidget toy or stress ball as well as her "  hands shaking") that Makayla Livingston noted primarily occurs secondary to "anxiety or panic," and interrupting others. Ms. Vranich also shared a history of depressive episodes that include dissatisfaction with current life circumstances, anhedonia, fatigue, and sleep problems; generalized anxiety-related symptoms (e.g., fusing to memories of past negative events and worse case scenarios) that feels "uncontrollable" and occurs more days than not; social anxiety-related symptoms (e.g., difficulty "talking in front of groups of people," concerns about how others perceive her, worry Makayla Livingston will "say a wrong word or say something incorrectly") that includes avoidance of social situations and significant distress when required to be in them; fear of germs or contamination (e.g., efforts to avoid touching items that are perceived as having been touched by multiple others and "us[ing] sanitizer immediately" if Makayla Livingston does touch an aforementioned item); eating only "once a day" and having her food and beverage choices gravitate toward "sweets," noting Makayla Livingston "used to have a healthier diet" but her diet seemingly negatively changed 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 Makayla Livingston expressed a belief her  ADHD-related concerns are "consistently there" and independent of mood, although continued to indicate "depression and anxiety" became exacerbated by the previous seizures and various ADHD-related concerns (e.g., forgetfulness, impulsivity, difficulty waiting, concentration and task initiation issues) were present only after the seizures while other ADHD-related concerns (e.g., irritability, disorganization, missing small details, and excessive talking) became "unmanageable" after the seizures.   Ms. Rood denied awareness of having ever experienced any developmental milestone delays, grade retention, learning disability diagnosis, or having an individualized education plan, although noted Makayla Livingston had to repeat a Geometry class in high school which may have been due to the "small details" involved in the class and having thoughts of "What's the point?" about some of the "calculations." Makayla Livingston reported Makayla Livingston "skipped a grade" while being home schooled and attended "honors classes" throughout high school, stating Makayla Livingston "did not have a choice" regarding schooling as her mother placed importance on it and her academic performance. Ms. Labranche denied experiencing any behavioral issues throughout schooling, and shared Makayla Livingston generally got along well with teachers and her classmates. Makayla Livingston stated Makayla Livingston previously dropped out of college secondary to seizures that occurred, moving out of state, and financial limitations but Makayla Livingston is currently in her senior year of obtaining a bachelor's degree in social psychology, and Makayla Livingston has a 4.0 GPA. Makayla Livingston further stated Makayla Livingston enjoys college classes but noted it can be "stressful." Ms. Nuding discussed her current employment is in customer service with [employer], and that experiences "dread about going" to her employment as Makayla Livingston regularly speaks with "angry people" and has limited support from her employer.   Ms. Gent reported her medical history is significant for seizures, a blood clot in her lung last year,  and having COVID-19 and pneumonia in May of this year. Makayla Livingston denied awareness of ever experiencing traumatic brain injury. Makayla Livingston reported use of psychotherapeutic services after having experienced seizures in 2016 and 2017 as well as utilization of Prozac for anxiety. Makayla Livingston also reported daily use of cannabis in an effort to improve attention, "calm the mind," and assist with sleep onset as well as daily use of one [facility]. Ms. Flook denied use of all other recreational  and illicit substances. Makayla Livingston also denied ever experiencing psychiatric hospitalization or meeting full criteria for hypomanic or manic episode; psychosis; homicidal ideation, plan, or intent; or legal involvement. Makayla Livingston stated her familial mental health history is significant for possible ADHD as they are "easily distracted" (uncles) and possible OCD as Makayla Livingston has a strong Makayla Livingston for "things to be in a certain order" and if they are "out of place" it causes significant distress (mother).   Chart Review: Per an appointment note dated 09/26/2022, Dr. Carmelia Roller reported Ms. Bauza has a "multiyear history of anxiety/depression with a predominance towards anxiety" as well as experienced "stress induced" seizures in 2016. He further reported Makayla Livingston is "going to school and working full-time over the past couple years which is stressful." Makayla Livingston was prescribed Lexapro 5MG  for her endorsed anxiety and depression.   Per an appointment note dated 03/19/2023, Dr. Carmelia Roller reported "[Ms. Wheless] has lots of stress and wonders if her poor concentration is due to this or possibly ADHD. Makayla Livingston notes that her attention issue did not worsen until the anxiety/depression came into play."              Margarite Gouge, PsyD

## 2023-02-15 ENCOUNTER — Ambulatory Visit (INDEPENDENT_AMBULATORY_CARE_PROVIDER_SITE_OTHER): Payer: BC Managed Care – PPO | Admitting: Psychology

## 2023-02-15 DIAGNOSIS — F89 Unspecified disorder of psychological development: Secondary | ICD-10-CM | POA: Diagnosis not present

## 2023-03-02 ENCOUNTER — Ambulatory Visit: Payer: BC Managed Care – PPO | Admitting: Psychology

## 2023-03-02 DIAGNOSIS — F89 Unspecified disorder of psychological development: Secondary | ICD-10-CM

## 2023-03-02 NOTE — Progress Notes (Signed)
Date: 03/02/2023   Appointment Start Time: 8am Duration: 70 minutes Provider: Helmut Muster, PsyD Type of Session: Testing Appointment for Evaluation  Location of Patient: Home Location of Provider: Provider's Home (private office) Type of Contact: Caregility video visit with audio  Session Content: Today's appointment was a telepsychological visit. Ok is aware it is her responsibility to secure confidentiality on her end of the session. Prior to proceeding with today's appointment, Makayla Livingston's physical location at the time of this appointment was obtained as well a phone number she could be reached at in the event of technical difficulties. Makayla Livingston denied anyone else being present in the room or on the virtual appointment. This provider reviewed that video should not be captured, photos should not be taken, nor should testing stimuli be copied or recorded as it would be a copyright violation. Makayla Livingston expressed understanding of the aforementioned, and verbally consented to proceed. The WAIS-IV was administered, scored, and interpreted by this evaluator. Makayla Livingston is aware of the limitations of teleheath visits and verbally consented to proceed.  Billing codes will be input on the feedback appointment. There are no billing codes for the testing appointment.   Provisional DSM-5 diagnosis(es):  F89 Unspecified Disorder of Psychological Development   Plan: Makayla Livingston was scheduled for a feedback appointment on 03/16/2023 at 10am via Caregility video visit with audio.                Margarite Gouge, PsyD

## 2023-03-15 NOTE — Progress Notes (Unsigned)
Testing and Report Writing Information: The following measures  were administered, scored, and interpreted by this provider:  Generalized Anxiety Disorder-7 (GAD-7; 5 minutes), Patient Health Questionnaire-9 (PHQ-9; 5 minutes), Wechsler Adult Intelligence Scale-Fourth Edition (WAIS-IV; 70 minutes), CNS Vital Signs (45 minutes), Adult Attention Deficit/Hyperactivity Disorder Self-Report Scale Checklist (ASRSv1.1; 15 minutes), Behavior Rating Inventory for Executive Function - A - Self Report (BRIEF A; 10 minutes) and Behavior Rating Inventory for Executive Function - A - Informant *** (BRIEF-A; 10 minutes), PTSD Checklist for DSM-5 (PCL-5; 15 minutes), and Personality Assessment Inventory (PAI; 50 minutes). A total of 225 minutes was spent on the administration and scoring of the aforementioned measures. Codes 08657 and [phone] (6 units) were billed.  Please see the assessment for additional details. This provider completed the written report which includes integration of patient data, interpretation of standardized test results, interpretation of clinical data, review of information provided by Trinisha and any collateral information/documentation, and clinical decision making (360 minutes in total).  Feedback Appointment: Date: 03/16/2023 Appointment Start Time: *** Duration: *** minutes Provider: Helmut Muster, PsyD Type of Session: Feedback Appointment for Evaluation  Location of Patient: {gbptloc:[id number]} Location of Provider: Provider's Home (private office) Type of Contact: [employer] Teams video visit with audio  Session Content: Today's appointment was a telepsychological visit due to COVID-19. Cristabel is aware it is her responsibility to secure confidentiality on her end of the session. She provided verbal consent to proceed with today's appointment. Prior to proceeding with today's appointment, Leshia's physical location at the time of this appointment was obtained as well a phone number she could be  reached at in the event of technical difficulties. Daleah denied anyone else being present in the room or on the virtual appointment. Gentry is aware of the limitations of teleheath visits and verbally consented to proceed.  This provider and Davion completed the interactive feedback session which includes reviewing the aforementioned measures, treatment recommendations, and diagnostic conclusions.   The interactive feedback session was completed today and a total of *** minutes was spent on feedback. Code 29528 was billed for feedback session.   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 02/08/2023: 10:25-10:45am (inputting chart review information into the evaluation). 02/17/2023: 8:45-8:55am and 6:20-6:55pm. 02/18/2023: 5:40-6:35pm and 8:05-8:15pm. 02/19/2023: 6:50-6:55am. 02/20/2023: 8:35-8:45pm. 02/21/2023: 7:55-8:20pm. 02/22/2023: 9:25-9:50am. 03/02/2023: 10:10-10:35am. 03/05/2023: 12:15-12:20pm. 03/07/2023: 11-11:15am and 7:45-8:35pm. 03/09/2023: 9:40-10am and 10:25-10:50am. 03/12/2023: 10:15-10:40am.  (billing code [id number] [6 units])  Plan: Elijah Birk provided verbal consent for her evaluation to be sent via e-mail. No further follow-up planned by this provider. ***                 Margarite Gouge, PsyD

## 2023-03-16 ENCOUNTER — Ambulatory Visit (INDEPENDENT_AMBULATORY_CARE_PROVIDER_SITE_OTHER): Payer: BC Managed Care – PPO | Admitting: Psychology

## 2023-03-16 DIAGNOSIS — F332 Major depressive disorder, recurrent severe without psychotic features: Secondary | ICD-10-CM

## 2023-03-16 DIAGNOSIS — F411 Generalized anxiety disorder: Secondary | ICD-10-CM

## 2023-03-16 DIAGNOSIS — F908 Attention-deficit hyperactivity disorder, other type: Secondary | ICD-10-CM

## 2024-01-05 ENCOUNTER — Encounter: Payer: Self-pay | Admitting: Family Medicine
